# Patient Record
Sex: Male | Born: 1955 | Race: Black or African American | Hispanic: No | Marital: Married | Smoking: Former smoker
Health system: Southern US, Community
[De-identification: ages and names within clinical notes are randomized; demographics above are authoritative.]

## PROBLEM LIST (undated history)

## (undated) DIAGNOSIS — I10 Essential (primary) hypertension: Secondary | ICD-10-CM

## (undated) DIAGNOSIS — E119 Type 2 diabetes mellitus without complications: Secondary | ICD-10-CM

## (undated) HISTORY — PX: COLON SURGERY: SHX602

## (undated) HISTORY — PX: REPLACEMENT TOTAL KNEE: SUR1224

## (undated) HISTORY — PX: ROTATOR CUFF REPAIR: SHX139

## (undated) HISTORY — PX: ANKLE SURGERY: SHX546

---

## 2004-12-25 ENCOUNTER — Emergency Department: Payer: Self-pay | Admitting: Unknown Physician Specialty

## 2008-12-23 ENCOUNTER — Emergency Department: Payer: Self-pay | Admitting: Emergency Medicine

## 2008-12-27 ENCOUNTER — Ambulatory Visit: Payer: Self-pay | Admitting: Unknown Physician Specialty

## 2008-12-30 ENCOUNTER — Inpatient Hospital Stay: Payer: Self-pay | Admitting: Unknown Physician Specialty

## 2015-07-27 ENCOUNTER — Emergency Department: Payer: Self-pay

## 2015-07-27 ENCOUNTER — Emergency Department
Admission: EM | Admit: 2015-07-27 | Discharge: 2015-07-28 | Disposition: A | Discharge status: Home / Self Care | Payer: Self-pay | Attending: Emergency Medicine | Admitting: Emergency Medicine

## 2015-07-27 ENCOUNTER — Encounter: Payer: Self-pay | Admitting: Emergency Medicine

## 2015-07-27 DIAGNOSIS — Z7982 Long term (current) use of aspirin: Secondary | ICD-10-CM | POA: Insufficient documentation

## 2015-07-27 DIAGNOSIS — R000 Tachycardia, unspecified: Secondary | ICD-10-CM | POA: Insufficient documentation

## 2015-07-27 DIAGNOSIS — R079 Chest pain, unspecified: Secondary | ICD-10-CM | POA: Insufficient documentation

## 2015-07-27 DIAGNOSIS — E119 Type 2 diabetes mellitus without complications: Secondary | ICD-10-CM | POA: Insufficient documentation

## 2015-07-27 DIAGNOSIS — R05 Cough: Secondary | ICD-10-CM | POA: Insufficient documentation

## 2015-07-27 DIAGNOSIS — F17210 Nicotine dependence, cigarettes, uncomplicated: Secondary | ICD-10-CM | POA: Insufficient documentation

## 2015-07-27 DIAGNOSIS — Z79899 Other long term (current) drug therapy: Secondary | ICD-10-CM | POA: Insufficient documentation

## 2015-07-27 DIAGNOSIS — R067 Sneezing: Secondary | ICD-10-CM | POA: Insufficient documentation

## 2015-07-27 DIAGNOSIS — I10 Essential (primary) hypertension: Secondary | ICD-10-CM | POA: Insufficient documentation

## 2015-07-27 HISTORY — DX: Essential (primary) hypertension: I10

## 2015-07-27 HISTORY — DX: Type 2 diabetes mellitus without complications: E11.9

## 2015-07-27 LAB — COMPREHENSIVE METABOLIC PANEL
ALT: 12 U/L — ABNORMAL LOW (ref 17–63)
ANION GAP: 9 (ref 5–15)
AST: 13 U/L — ABNORMAL LOW (ref 15–41)
Albumin: 4.1 g/dL (ref 3.5–5.0)
Alkaline Phosphatase: 77 U/L (ref 38–126)
BUN: 29 mg/dL — ABNORMAL HIGH (ref 6–20)
CALCIUM: 8.2 mg/dL — AB (ref 8.9–10.3)
CHLORIDE: 102 mmol/L (ref 101–111)
CO2: 21 mmol/L — AB (ref 22–32)
Creatinine, Ser: 2.05 mg/dL — ABNORMAL HIGH (ref 0.61–1.24)
GFR calc non Af Amer: 34 mL/min — ABNORMAL LOW (ref >60)
GFR, EST AFRICAN AMERICAN: 39 mL/min — AB (ref >60)
Glucose, Bld: 243 mg/dL — ABNORMAL HIGH (ref 65–99)
POTASSIUM: 3.8 mmol/L (ref 3.5–5.1)
SODIUM: 132 mmol/L — AB (ref 135–145)
Total Bilirubin: 0.6 mg/dL (ref 0.3–1.2)
Total Protein: 7.3 g/dL (ref 6.5–8.1)

## 2015-07-27 LAB — FIBRIN DERIVATIVES D-DIMER (ARMC ONLY): FIBRIN DERIVATIVES D-DIMER (ARMC): 533 — AB (ref 0–499)

## 2015-07-27 LAB — CBC
HCT: 42.3 % (ref 40.0–52.0)
Hemoglobin: 14.7 g/dL (ref 13.0–18.0)
MCH: 32.9 pg (ref 26.0–34.0)
MCHC: 34.9 g/dL (ref 32.0–36.0)
MCV: 94.4 fL (ref 80.0–100.0)
PLATELETS: 172 10*3/uL (ref 150–440)
RBC: 4.48 MIL/uL (ref 4.40–5.90)
RDW: 13.5 % (ref 11.5–14.5)
WBC: 9.3 10*3/uL (ref 3.8–10.6)

## 2015-07-27 LAB — TROPONIN I: Troponin I: <0.03 ng/mL (ref <0.031)

## 2015-07-27 MED ORDER — TRAMADOL HCL 50 MG PO TABS
50.0000 mg | ORAL_TABLET | Freq: Once | ORAL | Status: AC
  Administered 2015-07-27: 50 mg via ORAL
  Filled 2015-07-27: qty 1

## 2015-07-27 MED ORDER — SODIUM CHLORIDE 0.9 % IV BOLUS (SEPSIS)
1000.0000 mL | Freq: Once | INTRAVENOUS | Status: AC
  Administered 2015-07-27: 1000 mL via INTRAVENOUS

## 2015-07-27 NOTE — ED Notes (Addendum)
Pt says he sneezed 1 week ago and then has had a cough since-when he sneezed he experienced sharp pain just right of center chest; pain has been there since; denies shortness of breath; denies fever; cough is nonproductive; pt says no SOB but faint audible wheezes noted in triage

## 2015-07-27 NOTE — ED Provider Notes (Signed)
[facility] Emergency Department Provider Note  ____________________________________________  Time seen: Approximately 2317 PM  I have reviewed the triage vital signs and the nursing notes.   HISTORY  Chief Complaint Chest Pain and Cough    HPI Aaron Pham is a 59 y.o. male who comes into the hospital today with chest pain. The patient reports that he sneezed one week ago and he sneezed so hard that he started having some pain in the right side of his chest. The patient reports that he has been taking Profen for pain but the pain has continued. The patient reports that when he is not moving it doesn't hurt but when he moves OR sneezes the pain is worse. The patient reports he is here because of continued for the past week. The patient reports that he goes to the [facility] in [place] but has not been there for this pain. The patient reports that he's only been coughing and sneezing a little bit. He denies any fever shortness of breath dizziness lightheadedness sweats abdominal pain nausea or vomiting. The patient rates his pain a 6 out of 10 in intensity when he moves. The patient reports that 6-7 years ago he had the flu and cough so much that he bruised his ribs but he was not in the same location. The patient comes in tonight for further evaluation of these symptoms. The patient reports that he does have a history of seasonal allergies.   Past Medical History  Diagnosis Date  . Diabetes mellitus without complication (HCC)   . Hypertension     There are no active problems to display for this patient.   Past Surgical History  Procedure Laterality Date  . Replacement total knee    . Rotator cuff repair    . Ankle surgery    . Colon surgery      Current Outpatient Rx  Name  Route  Sig  Dispense  Refill  . aspirin 81 MG tablet   Oral   Take 81 mg by mouth daily.         . cetirizine (ZYRTEC) 10 MG tablet   Oral   Take 10 mg by mouth daily.         .  CHLORTHALIDONE PO   Oral   Take 0.5 tablets by mouth daily.         Marland Kitchen. GLIPIZIDE PO   Oral   Take 0.5 tablets by mouth 2 (two) times daily.         Marland Kitchen. LISINOPRIL PO   Oral   Take 1 tablet by mouth daily.         . metFORMIN (GLUCOPHAGE) 1000 MG tablet   Oral   Take 1,000 mg by mouth 2 (two) times daily with a meal.         . traMADol (ULTRAM) 50 MG tablet   Oral   Take 1 tablet (50 mg total) by mouth every 6 (six) hours as needed.   12 tablet   0     Allergies Hydroxyzine  History reviewed. No pertinent family history.  Social History Social History  Substance Use Topics  . Smoking status: Current Some Day Smoker    Types: Cigarettes  . Smokeless tobacco: Never Used  . Alcohol Use: Yes    Review of Systems Constitutional: No fever/chills Eyes: No visual changes. ENT: No sore throat. Cardiovascular:  chest pain Respiratory: Cough and sneezing but Denies shortness of breath. Gastrointestinal: No abdominal pain.  No nausea, no  vomiting.  No diarrhea.  No constipation. Genitourinary: Negative for dysuria. Musculoskeletal: Negative for back pain. Skin: Negative for rash. Neurological: Negative for headaches, focal weakness or numbness.  10-point ROS otherwise negative.  ____________________________________________   PHYSICAL EXAM:  VITAL SIGNS: ED Triage Vitals  Enc Vitals Group     BP 07/27/15 2240 117/60 mmHg     Pulse Rate 07/27/15 2240 107     Resp 07/27/15 2240 24     Temp 07/27/15 2240 98.8 F (37.1 C)     Temp Source 07/27/15 2240 Oral     SpO2 07/27/15 2240 94 %     Weight 07/27/15 2240 240 lb (108.863 kg)     Height 07/27/15 2240  (1.676 m)     Head Cir --      Peak Flow --      Pain Score 07/27/15 2240 6     Pain Loc --      Pain Edu? --      Excl. in GC? --     Constitutional: Alert and oriented. Well appearing and in mild distress. Eyes: Conjunctivae are normal. PERRL. EOMI. Head: Atraumatic. Nose: No  congestion/rhinnorhea. Mouth/Throat: Mucous membranes are moist.  Oropharynx non-erythematous. Cardiovascular: Tachycardia regular rhythm. Grossly normal heart sounds.  Good peripheral circulation. Respiratory: Normal respiratory effort.  No retractions. Lungs CTAB. Gastrointestinal: Soft and nontender. No distention. Positive bowel sounds Musculoskeletal: No lower extremity tenderness nor edema.   Neurologic:  Normal speech and language.  Skin:  Skin is warm, dry and intact.  Psychiatric: Mood and affect are normal.   ____________________________________________   LABS (all labs ordered are listed, but only abnormal results are displayed)  Labs Reviewed  COMPREHENSIVE METABOLIC PANEL - Abnormal; Notable for the following:    Sodium 132 (*)    CO2 21 (*)    Glucose, Bld 243 (*)    BUN 29 (*)    Creatinine, Ser 2.05 (*)    Calcium 8.2 (*)    AST 13 (*)    ALT 12 (*)    GFR calc non Af Amer 34 (*)    GFR calc Af Amer 39 (*)    All other components within normal limits  FIBRIN DERIVATIVES D-DIMER ([facility] ONLY) - Abnormal; Notable for the following:    Fibrin derivatives D-dimer ([facility]) 533 (*)    All other components within normal limits  CBC  TROPONIN I  BRAIN NATRIURETIC PEPTIDE  I-STAT CHEM 8, ED   ____________________________________________  EKG  ED ECG REPORT I, Rebecka Apley, the attending physician, personally viewed and interpreted this ECG.   Date: 07/27/2015  EKG Time: 2237  Rate: 104  Rhythm: sinus tachycardia  Axis: normal  Intervals:none  ST&T Change: none  ____________________________________________  RADIOLOGY  Chest x-ray: Minimal subsegmental atelectasis at the left lung base, otherwise no acute process CT chest: No definite acute intrathoracic process allowing for breathing motion, mild cardiomegaly and coronary artery calcifications. ____________________________________________   PROCEDURES  Procedure(s) performed: None  Critical  Care performed: No  ____________________________________________   INITIAL IMPRESSION / ASSESSMENT AND PLAN / ED COURSE  Pertinent labs & imaging results that were available during my care of the patient were reviewed by me and considered in my medical decision making (see chart for details).  This is a 59 year old male who comes in today with some chest pain after sneezing one week ago. The patient reports that when he is laying still the pain is not bad but when he is moving  its worse. My concern is that the patient does have some tachycardia at this time. I will have some blood work done and didn't check a d-dimer as the patient also does say he has pain with deep inspiration. I will give the patient a liter of normal saline as well as tramadol 50 mg orally for pain. I will reassess the patient once I received the results of his blood work as well as after his liter of normal saline.  The patient's pain was improved after tramadol and his heart rate was improved after 1L NS. The patient's CT scan did not show a PE and his blood work is unremarkable. He will be discharged to follow up with his primary care physician. As the patient has had chest pain for 1 week that has not changed, I feel that we do not need to repeat his troponin. The patient agrees with the plan as stated ____________________________________________   FINAL CLINICAL IMPRESSION(S) / ED DIAGNOSES  Final diagnoses:  Chest pain, unspecified chest pain type      Rebecka Apley, MD 07/28/15 [phone]

## 2015-07-28 ENCOUNTER — Emergency Department: Payer: Non-veteran care

## 2015-07-28 LAB — BRAIN NATRIURETIC PEPTIDE: B Natriuretic Peptide: 13 pg/mL (ref 0.0–100.0)

## 2015-07-28 MED ORDER — IOHEXOL 350 MG/ML SOLN
80.0000 mL | Freq: Once | INTRAVENOUS | Status: AC | PRN
  Administered 2015-07-28: 80 mL via INTRAVENOUS

## 2015-07-28 MED ORDER — SODIUM CHLORIDE 0.9 % IV BOLUS (SEPSIS): 500.0000 mL | Freq: Once | INTRAVENOUS | Status: DC

## 2015-07-28 MED ORDER — TRAMADOL HCL 50 MG PO TABS: 50.0000 mg | ORAL_TABLET | Freq: Four times a day (QID) | ORAL | Status: DC | PRN

## 2015-07-28 NOTE — Discharge Instructions (Signed)
Nonspecific Chest Pain  °Chest pain can be caused by many different conditions. There is always a chance that your pain could be related to something serious, such as a heart attack or a blood clot in your lungs. Chest pain can also be caused by conditions that are not life-threatening. If you have chest pain, it is very important to follow up with your health care provider. °CAUSES  °Chest pain can be caused by: °· Heartburn. °· Pneumonia or bronchitis. °· Anxiety or stress. °· Inflammation around your heart (pericarditis) or lung (pleuritis or pleurisy). °· A blood clot in your lung. °· A collapsed lung (pneumothorax). It can develop suddenly on its own (spontaneous pneumothorax) or from trauma to the chest. °· Shingles infection (varicella-zoster virus). °· Heart attack. °· Damage to the bones, muscles, and cartilage that make up your chest wall. This can include: °¨ Bruised bones due to injury. °¨ Strained muscles or cartilage due to frequent or repeated coughing or overwork. °¨ Fracture to one or more ribs. °¨ Sore cartilage due to inflammation (costochondritis). °RISK FACTORS  °Risk factors for chest pain may include: °· Activities that increase your risk for trauma or injury to your chest. °· Respiratory infections or conditions that cause frequent coughing. °· Medical conditions or overeating that can cause heartburn. °· Heart disease or family history of heart disease. °· Conditions or health behaviors that increase your risk of developing a blood clot. °· Having had chicken pox (varicella zoster). °SIGNS AND SYMPTOMS °Chest pain can feel like: °· Burning or tingling on the surface of your chest or deep in your chest. °· Crushing, pressure, aching, or squeezing pain. °· Dull or sharp pain that is worse when you move, cough, or take a deep breath. °· Pain that is also felt in your back, neck, shoulder, or arm, or pain that spreads to any of these areas. °Your chest pain may come and go, or it may stay  constant. °DIAGNOSIS °Lab tests or other studies may be needed to find the cause of your pain. Your health care provider may have you take a test called an ambulatory ECG (electrocardiogram). An ECG records your heartbeat patterns at the time the test is performed. You may also have other tests, such as: °· Transthoracic echocardiogram (TTE). During echocardiography, sound waves are used to create a picture of all of the heart structures and to look at how blood flows through your heart. °· Transesophageal echocardiogram (TEE). This is a more advanced imaging test that obtains images from inside your body. It allows your health care provider to see your heart in finer detail. °· Cardiac monitoring. This allows your health care provider to monitor your heart rate and rhythm in real time. °· Holter monitor. This is a portable device that records your heartbeat and can help to diagnose abnormal heartbeats. It allows your health care provider to track your heart activity for several days, if needed. °· Stress tests. These can be done through exercise or by taking medicine that makes your heart beat more quickly. °· Blood tests. °· Imaging tests. °TREATMENT  °Your treatment depends on what is causing your chest pain. Treatment may include: °· Medicines. These may include: °¨ Acid blockers for heartburn. °¨ Anti-inflammatory medicine. °¨ Pain medicine for inflammatory conditions. °¨ Antibiotic medicine, if an infection is present. °¨ Medicines to dissolve blood clots. °¨ Medicines to treat coronary artery disease. °· Supportive care for conditions that do not require medicines. This may include: °¨ Resting. °¨ Applying heat   or cold packs to injured areas. °¨ Limiting activities until pain decreases. °HOME CARE INSTRUCTIONS °· If you were prescribed an antibiotic medicine, finish it all even if you start to feel better. °· Avoid any activities that bring on chest pain. °· Do not use any tobacco products, including  cigarettes, chewing tobacco, or electronic cigarettes. If you need help quitting, ask your health care provider. °· Do not drink alcohol. °· Take medicines only as directed by your health care provider. °· Keep all follow-up visits as directed by your health care provider. This is important. This includes any further testing if your chest pain does not go away. °· If heartburn is the cause for your chest pain, you may be told to keep your head raised (elevated) while sleeping. This reduces the chance that acid will go from your stomach into your esophagus. °· Make lifestyle changes as directed by your health care provider. These may include: °¨ Getting regular exercise. Ask your health care provider to suggest some activities that are safe for you. °¨ Eating a heart-healthy diet. A registered dietitian can help you to learn healthy eating options. °¨ Maintaining a healthy weight. °¨ Managing diabetes, if necessary. °¨ Reducing stress. °SEEK MEDICAL CARE IF: °· Your chest pain does not go away after treatment. °· You have a rash with blisters on your chest. °· You have a fever. °SEEK IMMEDIATE MEDICAL CARE IF:  °· Your chest pain is worse. °· You have an increasing cough, or you cough up blood. °· You have severe abdominal pain. °· You have severe weakness. °· You faint. °· You have chills. °· You have sudden, unexplained chest discomfort. °· You have sudden, unexplained discomfort in your arms, back, neck, or jaw. °· You have shortness of breath at any time. °· You suddenly start to sweat, or your skin gets clammy. °· You feel nauseous or you vomit. °· You suddenly feel light-headed or dizzy. °· Your heart begins to beat quickly, or it feels like it is skipping beats. °These symptoms may represent a serious problem that is an emergency. Do not wait to see if the symptoms will go away. Get medical help right away. Call your local emergency services (911 in the [place] Do not drive yourself to the hospital. °  °This  information is not intended to replace advice given to you by your health care provider. Make sure you discuss any questions you have with your health care provider. °  °Document Released: 06/09/2005 Document Revised: 09/20/2014 Document Reviewed: 04/05/2014 °Elsevier Interactive Patient Education ©2016 [employer] ° °

## 2018-11-28 ENCOUNTER — Other Ambulatory Visit: Payer: Self-pay

## 2018-11-28 ENCOUNTER — Emergency Department: Payer: Self-pay

## 2018-11-28 ENCOUNTER — Emergency Department
Admission: EM | Admit: 2018-11-28 | Discharge: 2018-11-28 | Disposition: A | Discharge status: Home / Self Care | Payer: Self-pay | Attending: Emergency Medicine | Admitting: Emergency Medicine

## 2018-11-28 ENCOUNTER — Encounter: Payer: Self-pay | Admitting: Emergency Medicine

## 2018-11-28 DIAGNOSIS — J111 Influenza due to unidentified influenza virus with other respiratory manifestations: Secondary | ICD-10-CM

## 2018-11-28 DIAGNOSIS — Z7984 Long term (current) use of oral hypoglycemic drugs: Secondary | ICD-10-CM | POA: Insufficient documentation

## 2018-11-28 DIAGNOSIS — Z79899 Other long term (current) drug therapy: Secondary | ICD-10-CM | POA: Insufficient documentation

## 2018-11-28 DIAGNOSIS — J101 Influenza due to other identified influenza virus with other respiratory manifestations: Secondary | ICD-10-CM | POA: Insufficient documentation

## 2018-11-28 DIAGNOSIS — Z87891 Personal history of nicotine dependence: Secondary | ICD-10-CM | POA: Insufficient documentation

## 2018-11-28 DIAGNOSIS — I10 Essential (primary) hypertension: Secondary | ICD-10-CM | POA: Insufficient documentation

## 2018-11-28 DIAGNOSIS — Z7982 Long term (current) use of aspirin: Secondary | ICD-10-CM | POA: Insufficient documentation

## 2018-11-28 DIAGNOSIS — E119 Type 2 diabetes mellitus without complications: Secondary | ICD-10-CM | POA: Insufficient documentation

## 2018-11-28 LAB — INFLUENZA PANEL BY PCR (TYPE A & B)
INFLBPCR: POSITIVE — AB
Influenza A By PCR: NEGATIVE

## 2018-11-28 NOTE — ED Provider Notes (Signed)
[facility] Emergency Department Provider Note ____________________________________________  Time seen: 1030  I have reviewed the triage vital signs and the nursing notes.  HISTORY  Chief Complaint  Cough  HPI Aaron Pham is a 63 y.o. male resents himself to the ED for evaluation of a 2-week complaint of intermittent cough.  Patient denies any frank fevers, but has noted some chills.  He reports nasal drainage and an intermittently productive cough.  Patient denies any body aches, nausea, vomiting, chest pain, or shortness of breath.  He has not taken any medications in the interim for symptom relief.  He did receive the seasonal flu vaccine.  He denies any recent travel, sick or high risk contacts, or other exposures. He did receive the seasonal flu vaccine.   Past Medical History:  Diagnosis Date  . Diabetes mellitus without complication (HCC)   . Hypertension     There are no active problems to display for this patient.   Past Surgical History:  Procedure Laterality Date  . ANKLE SURGERY    . COLON SURGERY    . REPLACEMENT TOTAL KNEE    . ROTATOR CUFF REPAIR      Prior to Admission medications   Medication Sig Start Date End Date Taking? Authorizing Provider  aspirin 81 MG tablet Take 81 mg by mouth daily.    [provider]  cetirizine (ZYRTEC) 10 MG tablet Take 10 mg by mouth daily.    [provider]  CHLORTHALIDONE PO Take 0.5 tablets by mouth daily.    [provider]  GLIPIZIDE PO Take 0.5 tablets by mouth 2 (two) times daily.    [provider]  LISINOPRIL PO Take 1 tablet by mouth daily.    [provider]  metFORMIN (GLUCOPHAGE) 1000 MG tablet Take 1,000 mg by mouth 2 (two) times daily with a meal.    [provider]    Allergies Hydroxyzine  No family history on file.  Social History Social History   Tobacco Use  . Smoking status: Former Smoker    Types: Cigarettes  .  Smokeless tobacco: Never Used  Substance Use Topics  . Alcohol use: Yes  . Drug use: No    Review of Systems  Constitutional: Negative for fever.  Reports chills. Eyes: Negative for visual changes. ENT: Negative for sore throat. Cardiovascular: Negative for chest pain. Respiratory: Negative for shortness of breath.  Reports cough as above. Gastrointestinal: Negative for abdominal pain, vomiting and diarrhea. Genitourinary: Negative for dysuria. Musculoskeletal: Negative for back pain. Skin: Negative for rash. Neurological: Negative for headaches, focal weakness or numbness. ____________________________________________  PHYSICAL EXAM:  VITAL SIGNS: ED Triage Vitals  Enc Vitals Group     BP 11/28/18 1005 (!) 121/92     Pulse Rate 11/28/18 1005 93     Resp 11/28/18 1005 20     Temp 11/28/18 1005 99 F (37.2 C)     Temp Source 11/28/18 1005 Oral     SpO2 11/28/18 1005 97 %     Weight 11/28/18 1006 240 lb (108.9 kg)     Height 11/28/18 1006 5\' 6"  (1.676 m)     Head Circumference --      Peak Flow --      Pain Score 11/28/18 1005 0     Pain Loc --      Pain Edu? --      Excl. in GC? --     Constitutional: Alert and oriented. Well appearing and in no distress.  Head: Normocephalic and atraumatic. Eyes: Conjunctivae are normal. Normal extraocular movements Neck: Supple. Normal ROM without crepitus. Hematological/Lymphatic/Immunological: No cervical lymphadenopathy. Cardiovascular: Normal rate, regular rhythm. Normal distal pulses. Respiratory: Normal respiratory effort. No rales/rhonchi. Mild end-expiratory wheezes bilaterally.  Gastrointestinal: Soft and nontender. No distention. Musculoskeletal: Nontender with normal range of motion in all extremities.  Neurologic:  Normal gait without ataxia. Normal speech and language. No gross focal neurologic deficits are appreciated. Skin:  Skin is warm, dry and intact. No rash noted. ____________________________________________     LABS (pertinent positives/negatives) Labs Reviewed  INFLUENZA PANEL BY PCR (TYPE A & B) - Abnormal; Notable for the following components:      Result Value   Influenza B By PCR POSITIVE (*)    All other components within normal limits  ____________________________________________   RADIOLOGY  CXR IMPRESSION: 9 mm nodular opacity right base which appears more medial than would be expected with nipple shadow. Given this circumstance, unenhanced chest CT is advised to further evaluate.  Calcified granuloma left upper lobe. Left base atelectasis. No consolidation.  Stable cardiac silhouette.  Chest CT w/o CM IMPRESSION: 1. No suspicious pulmonary nodule. No CT correlate for the nodular density seen on chest x-ray. 2. Moderate central peribronchial thickening, consistent with airways inflammation. No consolidation. 3.  Aortic atherosclerosis (ICD10-I70.0). 4. Right adrenal adenoma, incompletely visualized. ____________________________________________  PROCEDURES  Procedures ____________________________________________  INITIAL IMPRESSION / ASSESSMENT AND PLAN / ED COURSE  Patient with ED evaluation of 2-week complaint of intermittent cough and congestion.  He is sent here at the request of his employer, for evaluation of ongoing symptoms.  Patient was found to be influenza B positive on PCR screen.  Patient clinically stable without any signs of acute respiratory distress.  He has declined any further medication management at this time.  He will manage his symptoms with over-the-counter aids as discussed.  A screening test CT was performed following and an incidental finding on his plain film.  The initial right lower lobe nodule was not found on CT scanning.  Patient is reassured by his overall results.  He is discharged with a work note holding him out of work for the next week.  He is advised to return to the ED for acutely worsening respiratory  symptoms. ____________________________________________  FINAL CLINICAL IMPRESSION(S) / ED DIAGNOSES  Final diagnoses:  Influenza  Influenza B      Karmen Stabs, Charlesetta Ivory, PA-C 11/28/18 1230    Rockne Menghini, MD 11/28/18 1546

## 2018-11-28 NOTE — ED Notes (Signed)
First Nurse Note: Patient complaining of cough, given face mask to wear.

## 2018-11-28 NOTE — ED Triage Notes (Signed)
Pt in via POV, reports nasal drainage, cough x one week; states "I feel like I have the flu but I get the flu shot."  Ambulatory to triage.  NAD noted at this time.

## 2018-11-28 NOTE — Discharge Instructions (Addendum)
You have been confirmed as influenza positive. Take OTC medicine to control cough and congestion. Rest, hydrate, and limit contact with others by staying at home. Follow-up with the [facility] or return to this ED for worsening respiratory symptoms.

## 2018-11-28 NOTE — ED Notes (Addendum)
See triage note  States he developed body aches with some fever and cough 2 weeks ago  States he conts to have cough  Denies any fever  States he thinks he has a cold  Afebrile on arrival

## 2020-05-09 ENCOUNTER — Telehealth: Payer: Self-pay | Admitting: Nurse Practitioner

## 2020-05-09 ENCOUNTER — Other Ambulatory Visit: Payer: Self-pay | Admitting: Nurse Practitioner

## 2020-05-09 NOTE — Telephone Encounter (Signed)
Error

## 2020-05-09 NOTE — Progress Notes (Signed)
I connected by phone with Aaron Pham on 05/09/2020 at 4:19 PM to discuss the potential use of a new treatment for mild to moderate COVID-19 viral infection in non-hospitalized patients.  This patient is a 64 y.o. male that meets the FDA criteria for Emergency Use Authorization of COVID monoclonal antibody casirivimab/imdevimab.  Has a (+) direct SARS-CoV-2 viral test result  Has mild or moderate COVID-19   Is NOT hospitalized due to COVID-19  Is within 10 days of symptom onset  Has at least one of the high risk factor(s) for progression to severe COVID-19 and/or hospitalization as defined in EUA.  Specific high risk criteria : BMI > 25, Diabetes and Cardiovascular disease or hypertension   I have spoken and communicated the following to the patient or parent/caregiver regarding COVID monoclonal antibody treatment:  1. FDA has authorized the emergency use for the treatment of mild to moderate COVID-19 in adults and pediatric patients with positive results of direct SARS-CoV-2 viral testing who are 64 years of age and older weighing at least 40 kg, and who are at high risk for progressing to severe COVID-19 and/or hospitalization.  2. The significant known and potential risks and benefits of COVID monoclonal antibody, and the extent to which such potential risks and benefits are unknown.  3. Information on available alternative treatments and the risks and benefits of those alternatives, including clinical trials.  4. Patients treated with COVID monoclonal antibody should continue to self-isolate and use infection control measures (e.g., wear mask, isolate, social distance, avoid sharing personal items, clean and disinfect "high touch" surfaces, and frequent handwashing) according to CDC guidelines.   5. The patient or parent/caregiver has the option to accept or refuse COVID monoclonal antibody treatment.  After reviewing this information with the patient, The patient agreed to proceed  with receiving casirivimab\imdevimab infusion and will be provided a copy of the Fact sheet prior to receiving the infusion.  Sx onset 05/07/20. Set up for infusion on 05/11/20. Directions given to [facility]. Pt is aware that insurance will be charged an infusion fee.   Sherril Cong 05/09/2020 4:19 PM

## 2020-05-11 ENCOUNTER — Ambulatory Visit (HOSPITAL_COMMUNITY)
Admission: RE | Admit: 2020-05-11 | Discharge: 2020-05-11 | Disposition: A | Discharge status: Home / Self Care | Payer: No Typology Code available for payment source | Source: Ambulatory Visit | Attending: Pulmonary Disease | Admitting: Pulmonary Disease

## 2020-05-11 DIAGNOSIS — U071 COVID-19: Secondary | ICD-10-CM | POA: Diagnosis not present

## 2020-05-11 MED ORDER — SODIUM CHLORIDE 0.9 % IV SOLN
1200.0000 mg | Freq: Once | INTRAVENOUS | Status: AC
  Administered 2020-05-11: 1200 mg via INTRAVENOUS
  Filled 2020-05-11: qty 10

## 2020-05-11 MED ORDER — ALBUTEROL SULFATE HFA 108 (90 BASE) MCG/ACT IN AERS: 2.0000 | INHALATION_SPRAY | Freq: Once | RESPIRATORY_TRACT | Status: DC | PRN

## 2020-05-11 MED ORDER — SODIUM CHLORIDE 0.9 % IV SOLN: INTRAVENOUS | Status: DC | PRN

## 2020-05-11 MED ORDER — EPINEPHRINE 0.3 MG/0.3ML IJ SOAJ: 0.3000 mg | Freq: Once | INTRAMUSCULAR | Status: DC | PRN

## 2020-05-11 MED ORDER — DIPHENHYDRAMINE HCL 50 MG/ML IJ SOLN: 50.0000 mg | Freq: Once | INTRAMUSCULAR | Status: DC | PRN

## 2020-05-11 MED ORDER — FAMOTIDINE IN NACL 20-0.9 MG/50ML-% IV SOLN: 20.0000 mg | Freq: Once | INTRAVENOUS | Status: DC | PRN

## 2020-05-11 MED ORDER — SODIUM CHLORIDE 0.9 % IV BOLUS
1000.0000 mL | Freq: Once | INTRAVENOUS | Status: AC
  Administered 2020-05-11: 1000 mL via INTRAVENOUS

## 2020-05-11 MED ORDER — METHYLPREDNISOLONE SODIUM SUCC 125 MG IJ SOLR: 125.0000 mg | Freq: Once | INTRAMUSCULAR | Status: DC | PRN

## 2020-05-11 NOTE — Progress Notes (Signed)
  Diagnosis: COVID-19  Physician:Dr Wright   Procedure: [facility] Med: casirivimab\imdevimab infusion - Provided patient with casirivimab\imdevimab fact sheet for patients, parents and caregivers prior to infusion.  Complications: No immediate complications noted.  Discharge: Discharged home   Tiasia Weberg W 05/11/2020  

## 2020-05-11 NOTE — Discharge Instructions (Signed)

## 2021-04-28 ENCOUNTER — Emergency Department
Admission: EM | Admit: 2021-04-28 | Discharge: 2021-04-28 | Disposition: A | Discharge status: Home / Self Care | Payer: No Typology Code available for payment source | Attending: Emergency Medicine | Admitting: Emergency Medicine

## 2021-04-28 ENCOUNTER — Emergency Department: Payer: No Typology Code available for payment source

## 2021-04-28 ENCOUNTER — Other Ambulatory Visit: Payer: Self-pay

## 2021-04-28 ENCOUNTER — Encounter: Payer: Self-pay | Admitting: Emergency Medicine

## 2021-04-28 DIAGNOSIS — I10 Essential (primary) hypertension: Secondary | ICD-10-CM | POA: Diagnosis not present

## 2021-04-28 DIAGNOSIS — Z79899 Other long term (current) drug therapy: Secondary | ICD-10-CM | POA: Diagnosis not present

## 2021-04-28 DIAGNOSIS — E119 Type 2 diabetes mellitus without complications: Secondary | ICD-10-CM | POA: Diagnosis not present

## 2021-04-28 DIAGNOSIS — R109 Unspecified abdominal pain: Secondary | ICD-10-CM | POA: Insufficient documentation

## 2021-04-28 DIAGNOSIS — N50812 Left testicular pain: Secondary | ICD-10-CM | POA: Diagnosis not present

## 2021-04-28 DIAGNOSIS — Z7984 Long term (current) use of oral hypoglycemic drugs: Secondary | ICD-10-CM | POA: Insufficient documentation

## 2021-04-28 DIAGNOSIS — Z87891 Personal history of nicotine dependence: Secondary | ICD-10-CM | POA: Diagnosis not present

## 2021-04-28 DIAGNOSIS — Z7982 Long term (current) use of aspirin: Secondary | ICD-10-CM | POA: Insufficient documentation

## 2021-04-28 LAB — CBC
HCT: 47.1 % (ref 39.0–52.0)
Hemoglobin: 16.6 g/dL (ref 13.0–17.0)
MCH: 33.5 pg (ref 26.0–34.0)
MCHC: 35.2 g/dL (ref 30.0–36.0)
MCV: 95.2 fL (ref 80.0–100.0)
Platelets: 157 10*3/uL (ref 150–400)
RBC: 4.95 MIL/uL (ref 4.22–5.81)
RDW: 13.5 % (ref 11.5–15.5)
WBC: 8.5 10*3/uL (ref 4.0–10.5)
nRBC: 0 % (ref 0.0–0.2)

## 2021-04-28 LAB — URINALYSIS, COMPLETE (UACMP) WITH MICROSCOPIC
Bacteria, UA: NONE SEEN
Bilirubin Urine: NEGATIVE
Glucose, UA: >=500 mg/dL — AB
Ketones, ur: 20 mg/dL — AB
Leukocytes,Ua: NEGATIVE
Nitrite: NEGATIVE
Protein, ur: NEGATIVE mg/dL
Specific Gravity, Urine: 1.027 (ref 1.005–1.030)
pH: 5 (ref 5.0–8.0)

## 2021-04-28 LAB — BASIC METABOLIC PANEL
Anion gap: 12 (ref 5–15)
BUN: 18 mg/dL (ref 8–23)
CO2: 18 mmol/L — ABNORMAL LOW (ref 22–32)
Calcium: 8.7 mg/dL — ABNORMAL LOW (ref 8.9–10.3)
Chloride: 107 mmol/L (ref 98–111)
Creatinine, Ser: 1.13 mg/dL (ref 0.61–1.24)
GFR, Estimated: >60 mL/min (ref >60)
Glucose, Bld: 141 mg/dL — ABNORMAL HIGH (ref 70–99)
Potassium: 4.3 mmol/L (ref 3.5–5.1)
Sodium: 137 mmol/L (ref 135–145)

## 2021-04-28 MED ORDER — TRAMADOL HCL 50 MG PO TABS: 50.0000 mg | ORAL_TABLET | Freq: Four times a day (QID) | ORAL | 0 refills | Status: DC | PRN

## 2021-04-28 MED ORDER — KETOROLAC TROMETHAMINE 30 MG/ML IJ SOLN
30.0000 mg | Freq: Once | INTRAMUSCULAR | Status: AC
  Administered 2021-04-28: 30 mg via INTRAVENOUS
  Filled 2021-04-28: qty 1

## 2021-04-28 MED ORDER — OXYCODONE-ACETAMINOPHEN 5-325 MG PO TABS
1.0000 | ORAL_TABLET | ORAL | Status: DC | PRN
  Administered 2021-04-28: 1 via ORAL
  Filled 2021-04-28: qty 1

## 2021-04-28 MED ORDER — SODIUM CHLORIDE 0.9 % IV SOLN
1000.0000 mL | Freq: Once | INTRAVENOUS | Status: AC
  Administered 2021-04-28: 1000 mL via INTRAVENOUS

## 2021-04-28 MED ORDER — ONDANSETRON HCL 4 MG/2ML IJ SOLN
4.0000 mg | Freq: Once | INTRAMUSCULAR | Status: AC
  Administered 2021-04-28: 4 mg via INTRAVENOUS
  Filled 2021-04-28: qty 2

## 2021-04-28 NOTE — ED Triage Notes (Signed)
Pt reports this am started with sudden onset of pain to his left side that moved down and into his left groin. Pt reports some NV as well.

## 2021-04-28 NOTE — ED Triage Notes (Signed)
Pt in via EMS from work. Pt reports when he got to work he started with left groin pain that radiates into his back. Pt also reported some nausea with it. Pt is diabetic.

## 2021-04-28 NOTE — ED Notes (Signed)
See triage note  Presents with sudden onset on flank /abd pain  States pain was on left  Moving into groin area  Positive n/v

## 2021-04-28 NOTE — ED Provider Notes (Signed)
[facility] Emergency Department Provider Note   ____________________________________________    I have reviewed the triage vital signs and the nursing notes.   HISTORY  Chief Complaint Flank Pain, Groin Pain, and Nausea     HPI Aaron Pham is a 65 y.o. male with history of diabetes who presents with complaints of left-sided flank pain.  Patient describes this morning he was feeling well but developed pain in his left testicle which then radiated to his left flank.  He denies dysuria.  No hematuria.  No history of kidney stones.  Is not take anything for this.  No fevers chills.  No injury to the area.  Past Medical History:  Diagnosis Date   Diabetes mellitus without complication (HCC)    Hypertension     There are no problems to display for this patient.   Past Surgical History:  Procedure Laterality Date   ANKLE SURGERY     COLON SURGERY     REPLACEMENT TOTAL KNEE     ROTATOR CUFF REPAIR      Prior to Admission medications   Medication Sig Start Date End Date Taking? Authorizing Provider  traMADol (ULTRAM) 50 MG tablet Take 1 tablet (50 mg total) by mouth every 6 (six) hours as needed. 04/28/21 04/28/22 Yes Jene Every, MD  aspirin 81 MG tablet Take 81 mg by mouth daily.    [provider]  cetirizine (ZYRTEC) 10 MG tablet Take 10 mg by mouth daily.    [provider]  CHLORTHALIDONE PO Take 0.5 tablets by mouth daily.    [provider]  GLIPIZIDE PO Take 0.5 tablets by mouth 2 (two) times daily.    [provider]  LISINOPRIL PO Take 1 tablet by mouth daily.    [provider]  metFORMIN (GLUCOPHAGE) 1000 MG tablet Take 1,000 mg by mouth 2 (two) times daily with a meal.    [provider]     Allergies Hydroxyzine  No family history on file.  Social History Social History   Tobacco Use   Smoking status: Former    Types: Cigarettes   Smokeless tobacco: Never  Vaping Use    Vaping Use: Never used  Substance Use Topics   Alcohol use: Yes   Drug use: No    Review of Systems  Constitutional: No fever/chills Eyes: No visual changes.  ENT: No sore throat. Cardiovascular: Denies chest pain. Respiratory: Denies shortness of breath. Gastrointestinal: As above Genitourinary: As above Musculoskeletal: Negative for back pain. Skin: Negative for rash. Neurological: Negative for headaches or weakness   ____________________________________________   PHYSICAL EXAM:  VITAL SIGNS: ED Triage Vitals  Enc Vitals Group     BP 04/28/21 0846 (!) 122/96     Pulse Rate 04/28/21 0846 86     Resp 04/28/21 0846 18     Temp 04/28/21 0846 (!) 97.5 F (36.4 C)     Temp Source 04/28/21 0846 Oral     SpO2 04/28/21 0846 98 %     Weight 04/28/21 0845 108.9 kg (240 lb)     Height 04/28/21 0845 1.676 m (5\' 6" )     Head Circumference --      Peak Flow --      Pain Score 04/28/21 0845 10     Pain Loc --      Pain Edu? --      Excl. in GC? --     Constitutional: Alert and oriented.   Nose: No congestion/rhinnorhea. Mouth/Throat:  Mucous membranes are moist.   Cardiovascular: Normal rate, regular rhythm. Grossly normal heart sounds.  Good peripheral circulation. Respiratory: Normal respiratory effort.  No retractions. Gastrointestinal: Soft and nontender. No distention.   Musculoskeletal: No lower extremity tenderness nor edema.  Warm and well perfused Neurologic:  Normal speech and language. No gross focal neurologic deficits are appreciated.  Skin:  Skin is warm, dry and intact. No rash noted. Psychiatric: Mood and affect are normal. Speech and behavior are normal.  ____________________________________________   LABS (all labs ordered are listed, but only abnormal results are displayed)  Labs Reviewed  URINALYSIS, COMPLETE (UACMP) WITH MICROSCOPIC - Abnormal; Notable for the following components:      Result Value   Color, Urine YELLOW (*)    APPearance  CLEAR (*)    Glucose, UA >=500 (*)    Hgb urine dipstick SMALL (*)    Ketones, ur 20 (*)    All other components within normal limits  BASIC METABOLIC PANEL - Abnormal; Notable for the following components:   CO2 18 (*)    Glucose, Bld 141 (*)    Calcium 8.7 (*)    All other components within normal limits  CBC   ____________________________________________  EKG  None ____________________________________________  RADIOLOGY  CT renal stone study reviewed by me ____________________________________________   PROCEDURES  Procedure(s) performed: No  Procedures   Critical Care performed: No ____________________________________________   INITIAL IMPRESSION / ASSESSMENT AND PLAN / ED COURSE  Pertinent labs & imaging results that were available during my care of the patient were reviewed by me and considered in my medical decision making (see chart for details).   Patient presents with left flank pain as detailed above, highly suspicious for ureterolithiasis.  Patient is diabetic however no fever.  Urinalysis demonstrates microscopic hematuria, lab work is overall unremarkable.  Will give IV Toradol, IV Zofran, IV fluids, obtain CT renal stone study and reevaluate.  Patient given IV morphine for mild continued pain  This resolved his pain.  CT scan negative for ureterolithiasis or acute abnormality.  Patient feeling significantly better after treatment, question musculoskeletal back pain.  Discussed with patient, appropriate for discharge at this time with very strict return precautions if symptoms return or worsening or changes, he notes that he has very close follow-up at the [place]    ____________________________________________   FINAL CLINICAL IMPRESSION(S) / ED DIAGNOSES  Final diagnoses:  Flank pain        Note:  This document was prepared using Dragon voice recognition software and may include unintentional dictation errors.    Jene Every,  MD 04/28/21 1434

## 2021-06-11 ENCOUNTER — Emergency Department
Admission: EM | Admit: 2021-06-11 | Discharge: 2021-06-11 | Disposition: A | Discharge status: Home / Self Care | Payer: No Typology Code available for payment source | Attending: Emergency Medicine | Admitting: Emergency Medicine

## 2021-06-11 ENCOUNTER — Encounter: Payer: Self-pay | Admitting: Emergency Medicine

## 2021-06-11 ENCOUNTER — Emergency Department: Payer: No Typology Code available for payment source

## 2021-06-11 ENCOUNTER — Other Ambulatory Visit: Payer: Self-pay

## 2021-06-11 DIAGNOSIS — I10 Essential (primary) hypertension: Secondary | ICD-10-CM | POA: Insufficient documentation

## 2021-06-11 DIAGNOSIS — Z79899 Other long term (current) drug therapy: Secondary | ICD-10-CM | POA: Diagnosis not present

## 2021-06-11 DIAGNOSIS — R10A2 Flank pain, left side: Secondary | ICD-10-CM

## 2021-06-11 DIAGNOSIS — R109 Unspecified abdominal pain: Secondary | ICD-10-CM | POA: Diagnosis not present

## 2021-06-11 DIAGNOSIS — Z87891 Personal history of nicotine dependence: Secondary | ICD-10-CM | POA: Diagnosis not present

## 2021-06-11 DIAGNOSIS — E119 Type 2 diabetes mellitus without complications: Secondary | ICD-10-CM | POA: Diagnosis not present

## 2021-06-11 DIAGNOSIS — Z96659 Presence of unspecified artificial knee joint: Secondary | ICD-10-CM | POA: Insufficient documentation

## 2021-06-11 DIAGNOSIS — Z7982 Long term (current) use of aspirin: Secondary | ICD-10-CM | POA: Diagnosis not present

## 2021-06-11 DIAGNOSIS — Z7984 Long term (current) use of oral hypoglycemic drugs: Secondary | ICD-10-CM | POA: Diagnosis not present

## 2021-06-11 LAB — CBC WITH DIFFERENTIAL/PLATELET
Abs Immature Granulocytes: 0.07 10*3/uL (ref 0.00–0.07)
Basophils Absolute: 0 10*3/uL (ref 0.0–0.1)
Basophils Relative: 0 %
Eosinophils Absolute: 0.1 10*3/uL (ref 0.0–0.5)
Eosinophils Relative: 1 %
HCT: 48 % (ref 39.0–52.0)
Hemoglobin: 16.9 g/dL (ref 13.0–17.0)
Immature Granulocytes: 1 %
Lymphocytes Relative: 21 %
Lymphs Abs: 1.6 10*3/uL (ref 0.7–4.0)
MCH: 33.1 pg (ref 26.0–34.0)
MCHC: 35.2 g/dL (ref 30.0–36.0)
MCV: 94.1 fL (ref 80.0–100.0)
Monocytes Absolute: 1.1 10*3/uL — ABNORMAL HIGH (ref 0.1–1.0)
Monocytes Relative: 14 %
Neutro Abs: 4.9 10*3/uL (ref 1.7–7.7)
Neutrophils Relative %: 63 %
Platelets: 197 10*3/uL (ref 150–400)
RBC: 5.1 MIL/uL (ref 4.22–5.81)
RDW: 13.3 % (ref 11.5–15.5)
WBC: 7.8 10*3/uL (ref 4.0–10.5)
nRBC: 0 % (ref 0.0–0.2)

## 2021-06-11 LAB — BASIC METABOLIC PANEL
Anion gap: 11 (ref 5–15)
BUN: 16 mg/dL (ref 8–23)
CO2: 22 mmol/L (ref 22–32)
Calcium: 9.6 mg/dL (ref 8.9–10.3)
Chloride: 104 mmol/L (ref 98–111)
Creatinine, Ser: 1.2 mg/dL (ref 0.61–1.24)
GFR, Estimated: >60 mL/min (ref >60)
Glucose, Bld: 125 mg/dL — ABNORMAL HIGH (ref 70–99)
Potassium: 3.9 mmol/L (ref 3.5–5.1)
Sodium: 137 mmol/L (ref 135–145)

## 2021-06-11 LAB — URINALYSIS, COMPLETE (UACMP) WITH MICROSCOPIC
Bilirubin Urine: NEGATIVE
Glucose, UA: >=500 mg/dL — AB
Ketones, ur: 5 mg/dL — AB
Leukocytes,Ua: NEGATIVE
Nitrite: NEGATIVE
Protein, ur: NEGATIVE mg/dL
Specific Gravity, Urine: 1.035 — ABNORMAL HIGH (ref 1.005–1.030)
Squamous Epithelial / HPF: NONE SEEN (ref 0–5)
pH: 5 (ref 5.0–8.0)

## 2021-06-11 MED ORDER — HYDROMORPHONE HCL 1 MG/ML IJ SOLN
1.0000 mg | Freq: Once | INTRAMUSCULAR | Status: AC
  Administered 2021-06-11: 1 mg via INTRAVENOUS
  Filled 2021-06-11: qty 1

## 2021-06-11 MED ORDER — IOHEXOL 350 MG/ML SOLN
100.0000 mL | Freq: Once | INTRAVENOUS | Status: AC | PRN
  Administered 2021-06-11: 100 mL via INTRAVENOUS

## 2021-06-11 MED ORDER — SODIUM CHLORIDE 0.9 % IV BOLUS
1000.0000 mL | Freq: Once | INTRAVENOUS | Status: AC
  Administered 2021-06-11: 1000 mL via INTRAVENOUS

## 2021-06-11 MED ORDER — ONDANSETRON HCL 4 MG/2ML IJ SOLN
4.0000 mg | Freq: Once | INTRAMUSCULAR | Status: AC
  Administered 2021-06-11: 4 mg via INTRAVENOUS
  Filled 2021-06-11: qty 2

## 2021-06-11 MED ORDER — NAPROXEN 500 MG PO TABS: 500.0000 mg | ORAL_TABLET | Freq: Two times a day (BID) | ORAL | 0 refills | Status: DC

## 2021-06-11 MED ORDER — LIDOCAINE 5 % EX PTCH: 1.0000 | MEDICATED_PATCH | Freq: Two times a day (BID) | CUTANEOUS | 0 refills | Status: AC

## 2021-06-11 NOTE — ED Notes (Signed)
Pt to ED c/o 10/10 sharp stabbing pain in L groin/flank that started this morning. Hx kidney stones. Denies pain/burning/blood in urine. Pt profusely diaphoretic and writhing in bed. IV inserted, labs drawn.Wife at bedside.

## 2021-06-11 NOTE — ED Provider Notes (Signed)
[facility] Emergency Department Provider Note  ____________________________________________  Time seen: Approximately 8:24 AM  I have reviewed the triage vital signs and the nursing notes.   HISTORY  Chief Complaint Flank Pain    HPI Aaron Pham is a 65 y.o. male with a history of hypertension and diabetes who comes ED complaining of left flank pain radiating to left lower quadrant that started this morning, severe, constant, no aggravating or alleviating factors.  No vomiting or diarrhea.  No dysuria frequency or urgency, no hematuria.  Reports he had similar symptoms 5 or 6 weeks ago, was told he probably passed a kidney stone and went home.  He continued to have the pain for another 4 days at that time and then it went away.  Reviewed EMR, work-up from that time was unremarkable including a noncontrast CT scan was negative for stone, no hematuria or signs of infection in the urine, normal creatinine.    Past Medical History:  Diagnosis Date   Diabetes mellitus without complication (HCC)    Hypertension      There are no problems to display for this patient.    Past Surgical History:  Procedure Laterality Date   ANKLE SURGERY     COLON SURGERY     REPLACEMENT TOTAL KNEE     ROTATOR CUFF REPAIR       Prior to Admission medications   Medication Sig Start Date End Date Taking? Authorizing Provider  lidocaine (LIDODERM) 5 % Place 1 patch onto the skin every 12 (twelve) hours. Remove & Discard patch within 12 hours or as directed by MD 06/11/21  Yes Sharman Cheek, MD  naproxen (NAPROSYN) 500 MG tablet Take 1 tablet (500 mg total) by mouth 2 (two) times daily with a meal. 06/11/21  Yes Sharman Cheek, MD  aspirin 81 MG tablet Take 81 mg by mouth daily.    [provider]  cetirizine (ZYRTEC) 10 MG tablet Take 10 mg by mouth daily.    [provider]  CHLORTHALIDONE PO Take 0.5 tablets by mouth daily.    [provider]  GLIPIZIDE PO Take 0.5 tablets by mouth 2 (two) times daily.    [provider]  LISINOPRIL PO Take 1 tablet by mouth daily.    [provider]  metFORMIN (GLUCOPHAGE) 1000 MG tablet Take 1,000 mg by mouth 2 (two) times daily with a meal.    [provider]  traMADol (ULTRAM) 50 MG tablet Take 1 tablet (50 mg total) by mouth every 6 (six) hours as needed. 04/28/21 04/28/22  Jene Every, MD     Allergies Hydroxyzine   No family history on file.  Social History Social History   Tobacco Use   Smoking status: Former    Types: Cigarettes   Smokeless tobacco: Never  Vaping Use   Vaping Use: Never used  Substance Use Topics   Alcohol use: Yes   Drug use: No    Review of Systems  Constitutional:   No fever or chills.  ENT:   No sore throat. No rhinorrhea. Cardiovascular:   No chest pain or syncope. Respiratory:   No dyspnea or cough. Gastrointestinal:   Left lower quadrant abdominal pain as above without vomiting diarrhea or constipation.  Musculoskeletal:   Negative for focal pain or swelling All other systems reviewed and are negative except as documented above in ROS and HPI.  ____________________________________________   PHYSICAL EXAM:  VITAL SIGNS: ED Triage Vitals  Enc Vitals Group  BP 06/11/21 0727 (!) 170/100     Pulse Rate 06/11/21 0727 80     Resp 06/11/21 0727 (!) 21     Temp 06/11/21 0727 98.7 F (37.1 C)     Temp Source 06/11/21 0727 Oral     SpO2 06/11/21 0727 100 %     Weight 06/11/21 0723 240 lb 1.3 oz (108.9 kg)     Height 06/11/21 0723 5\' 6"  (1.676 m)     Head Circumference --      Peak Flow --      Pain Score 06/11/21 0723 10     Pain Loc --      Pain Edu? --      Excl. in GC? --     Vital signs reviewed, nursing assessments reviewed.   Constitutional:   Alert and oriented. Non-toxic appearance.  Very uncomfortable appearing Eyes:   Conjunctivae are normal. EOMI. PERRL. ENT      Head:    Normocephalic and atraumatic.      Nose:   Wearing a mask.      Mouth/Throat:   Wearing a mask.      Neck:   No meningismus. Full ROM. Hematological/Lymphatic/Immunilogical:   No cervical lymphadenopathy. Cardiovascular:   RRR. Symmetric bilateral radial and DP pulses.  No murmurs. Cap refill less than 2 seconds. Respiratory:   Normal respiratory effort without tachypnea/retractions. Breath sounds are clear and equal bilaterally. No wheezes/rales/rhonchi. Gastrointestinal:   Soft with left lower quadrant tenderness . Non distended. There is no CVA tenderness.  No rebound, rigidity, or guarding.  No hernias Genitourinary:   deferred Musculoskeletal:   Normal range of motion in all extremities. No joint effusions.  No lower extremity tenderness.  No edema. Neurologic:   Normal speech and language.  Motor grossly intact. No acute focal neurologic deficits are appreciated.  Skin:    Skin is warm, dry and intact. No rash noted.  No petechiae, purpura, or bullae.  ____________________________________________    LABS (pertinent positives/negatives) (all labs ordered are listed, but only abnormal results are displayed) Labs Reviewed  BASIC METABOLIC PANEL - Abnormal; Notable for the following components:      Result Value   Glucose, Bld 125 (*)    All other components within normal limits  CBC WITH DIFFERENTIAL/PLATELET - Abnormal; Notable for the following components:   Monocytes Absolute 1.1 (*)    All other components within normal limits  URINALYSIS, COMPLETE (UACMP) WITH MICROSCOPIC - Abnormal; Notable for the following components:   Color, Urine YELLOW (*)    APPearance CLEAR (*)    Specific Gravity, Urine 1.035 (*)    Glucose, UA >=500 (*)    Hgb urine dipstick SMALL (*)    Ketones, ur 5 (*)    Bacteria, UA RARE (*)    All other components within normal limits   ____________________________________________   EKG    ____________________________________________     RADIOLOGY  CT ABDOMEN PELVIS W CONTRAST  Result Date: 06/11/2021 CLINICAL DATA:  Left flank pain radiating from left back to left groin EXAM: CT ABDOMEN AND PELVIS WITH CONTRAST TECHNIQUE: Multidetector CT imaging of the abdomen and pelvis was performed using the standard protocol following bolus administration of intravenous contrast. CONTRAST:  06/13/2021 OMNIPAQUE IOHEXOL 350 MG/ML SOLN COMPARISON:  CT abdomen/pelvis 04/28/2021 FINDINGS: Lower chest: The lung bases are clear. The imaged heart is unremarkable. Hepatobiliary: The liver and gallbladder are unremarkable. There is no biliary ductal dilatation. Pancreas: Unremarkable. Spleen: Unremarkable. Adrenals/Urinary Tract: The adrenals are  unremarkable. A 1.0 cm hypodense lesion in the left upper pole and 1.2 cm hypodense lesion in the left interpolar region most likely reflect small cysts. There are no suspicious renal lesions. There are no stones within either kidney or along the course of either ureter. There is no hydronephrosis or hydroureter. The bladder is unremarkable. Stomach/Bowel: There is a small hiatal hernia. The stomach is otherwise unremarkable. There is no evidence of bowel obstruction. There is no abnormal bowel wall thickening or inflammatory change. The appendix is normal. A fat density lesion along the anterior margin of the rectum is unchanged, and may reflect a lipoma. Vascular/Lymphatic: There is scattered calcified atherosclerotic plaque throughout the nonaneurysmal abdominal aorta. The major branch vessels are patent. The main portal and splenic veins are patent. There is no abdominal or pelvic lymphadenopathy. Reproductive: The prostate and seminal vesicles are unremarkable. Other: There is no ascites or free air. There is a small fat containing left inguinal hernia and a tiny fat containing umbilical hernia. Musculoskeletal: There is multilevel degenerative change of the imaged spine. There is no acute osseous abnormality or  aggressive osseous lesion. IMPRESSION: 1. No acute findings in the abdomen or pelvis. Specifically, no evidence of nephrolithiasis or diverticulitis. 2. Small hypodense renal lesions again seen, most liklely cysts. Aortic Atherosclerosis (ICD10-I70.0). Electronically Signed   By: Lesia Hausen M.D.   On: 06/11/2021 09:03    ____________________________________________   PROCEDURES Procedures  ____________________________________________  DIFFERENTIAL DIAGNOSIS   Diverticulitis, kidney stone, hemorrhagic kidney cyst, bowel obstruction  CLINICAL IMPRESSION / ASSESSMENT AND PLAN / ED COURSE  Medications ordered in the ED: Medications  HYDROmorphone (DILAUDID) injection 1 mg (1 mg Intravenous Given 06/11/21 0813)  sodium chloride 0.9 % bolus 1,000 mL (1,000 mLs Intravenous New Bag/Given 06/11/21 0813)  ondansetron (ZOFRAN) injection 4 mg (4 mg Intravenous Given 06/11/21 0813)  iohexol (OMNIPAQUE) 350 MG/ML injection 100 mL (100 mLs Intravenous Contrast Given 06/11/21 0827)    Pertinent labs & imaging results that were available during my care of the patient were reviewed by me and considered in my medical decision making (see chart for details).  Kiernan Atkerson was evaluated in Emergency Department on 06/11/2021 for the symptoms described in the history of present illness. He was evaluated in the context of the global COVID-19 pandemic, which necessitated consideration that the patient might be at risk for infection with the SARS-CoV-2 virus that causes COVID-19. Institutional protocols and algorithms that pertain to the evaluation of patients at risk for COVID-19 are in a state of rapid change based on information released by regulatory bodies including the CDC and federal and state organizations. These policies and algorithms were followed during the patient's care in the ED.   Patient presents with severe left-sided abdominal pain with some tenderness.  Vital signs are unremarkable.  May be a  kidney stone, but with his tenderness, other intra-abdominal pathology is considered we will need to repeat the CT scan today with IV contrast.  Doubt aortic dissection or aneurysm.  Doubt splenic or renal artery occlusion or organ infarction.  Will give IV fluids for hydration, IV Zofran 4 mg for nausea relief and Dilaudid 1 mg IV for pain relief.  Clinical Course as of 06/11/21 1108  Thu Jun 11, 2021  0347 Labs and CT scan of the abdomen and pelvis are all unremarkable.  No evidence of diverticulitis perforation intra-abdominal abscess or kidney stone. [PS]    Clinical Course User Index [PS] Sharman Cheek, MD     -----------------------------------------  11:07 AM on 06/11/2021 ----------------------------------------- Patient feeling better, stable for discharge, recommend close follow-up with PCP.  ____________________________________________   FINAL CLINICAL IMPRESSION(S) / ED DIAGNOSES    Final diagnoses:  Left flank pain     ED Discharge Orders          Ordered    naproxen (NAPROSYN) 500 MG tablet  2 times daily with meals        06/11/21 1106    lidocaine (LIDODERM) 5 %  Every 12 hours        06/11/21 1106            Portions of this note were generated with dragon dictation software. Dictation errors may occur despite best attempts at proofreading.    Sharman Cheek, MD 06/11/21 1108

## 2021-06-11 NOTE — ED Triage Notes (Signed)
C/O left flank pain.  Pain started this morning after waking up.  Had similar symptoms 5 weeks ago, dx with kidney stone.  States pain radiating from left lower back to left groin.

## 2021-06-11 NOTE — Discharge Instructions (Addendum)
Your labs and CT scan today were all okay.  We are not able to find a cause of your pain, but your evaluation is reassuring.  Please follow-up with your doctor this coming week for further evaluation of the symptoms.  Anti-inflammatory medicine such as naproxen and lidocaine patch on the painful area and heating pad can be helpful.

## 2022-04-27 ENCOUNTER — Inpatient Hospital Stay
Admission: EM | Admit: 2022-04-27 | Discharge: 2022-04-29 | DRG: 920 | Disposition: A | Discharge status: Home / Self Care | Payer: No Typology Code available for payment source | Attending: Internal Medicine | Admitting: Internal Medicine

## 2022-04-27 ENCOUNTER — Encounter: Payer: Self-pay | Admitting: Emergency Medicine

## 2022-04-27 DIAGNOSIS — E119 Type 2 diabetes mellitus without complications: Secondary | ICD-10-CM

## 2022-04-27 DIAGNOSIS — K922 Gastrointestinal hemorrhage, unspecified: Secondary | ICD-10-CM | POA: Diagnosis not present

## 2022-04-27 DIAGNOSIS — K625 Hemorrhage of anus and rectum: Secondary | ICD-10-CM

## 2022-04-27 DIAGNOSIS — Z7982 Long term (current) use of aspirin: Secondary | ICD-10-CM

## 2022-04-27 DIAGNOSIS — Z8679 Personal history of other diseases of the circulatory system: Secondary | ICD-10-CM

## 2022-04-27 DIAGNOSIS — K635 Polyp of colon: Secondary | ICD-10-CM | POA: Diagnosis present

## 2022-04-27 DIAGNOSIS — K91840 Postprocedural hemorrhage and hematoma of a digestive system organ or structure following a digestive system procedure: Principal | ICD-10-CM | POA: Diagnosis present

## 2022-04-27 DIAGNOSIS — Y838 Other surgical procedures as the cause of abnormal reaction of the patient, or of later complication, without mention of misadventure at the time of the procedure: Secondary | ICD-10-CM

## 2022-04-27 DIAGNOSIS — Z7984 Long term (current) use of oral hypoglycemic drugs: Secondary | ICD-10-CM

## 2022-04-27 DIAGNOSIS — Z888 Allergy status to other drugs, medicaments and biological substances status: Secondary | ICD-10-CM

## 2022-04-27 DIAGNOSIS — Z87891 Personal history of nicotine dependence: Secondary | ICD-10-CM

## 2022-04-27 DIAGNOSIS — R55 Syncope and collapse: Secondary | ICD-10-CM | POA: Diagnosis present

## 2022-04-27 DIAGNOSIS — Z96659 Presence of unspecified artificial knee joint: Secondary | ICD-10-CM | POA: Diagnosis present

## 2022-04-27 DIAGNOSIS — Z79899 Other long term (current) drug therapy: Secondary | ICD-10-CM

## 2022-04-27 DIAGNOSIS — Z7901 Long term (current) use of anticoagulants: Secondary | ICD-10-CM

## 2022-04-27 DIAGNOSIS — I959 Hypotension, unspecified: Secondary | ICD-10-CM | POA: Diagnosis present

## 2022-04-27 DIAGNOSIS — Z6838 Body mass index (BMI) 38.0-38.9, adult: Secondary | ICD-10-CM

## 2022-04-27 DIAGNOSIS — I10 Essential (primary) hypertension: Secondary | ICD-10-CM | POA: Diagnosis present

## 2022-04-27 DIAGNOSIS — D62 Acute posthemorrhagic anemia: Secondary | ICD-10-CM | POA: Diagnosis present

## 2022-04-27 NOTE — ED Triage Notes (Signed)
Pt presents via POV with complaints of rectal bleeding that started tonight. Pt had a colonoscopy yesterday and had a large polyp removed and was fine until tonight when he's had 3 episodes of bloody stool with associated dizziness. Pt provides an image of bright red blood when using the restroom. Denies CP or SOB.

## 2022-04-28 ENCOUNTER — Inpatient Hospital Stay: Payer: No Typology Code available for payment source | Admitting: Anesthesiology

## 2022-04-28 ENCOUNTER — Encounter
Admission: EM | Disposition: A | Discharge status: Home / Self Care | Payer: Self-pay | Source: Home / Self Care | Attending: Internal Medicine

## 2022-04-28 ENCOUNTER — Other Ambulatory Visit: Payer: Self-pay

## 2022-04-28 ENCOUNTER — Encounter: Payer: Self-pay | Admitting: Family Medicine

## 2022-04-28 ENCOUNTER — Emergency Department: Payer: No Typology Code available for payment source

## 2022-04-28 DIAGNOSIS — Z79899 Other long term (current) drug therapy: Secondary | ICD-10-CM | POA: Diagnosis not present

## 2022-04-28 DIAGNOSIS — E119 Type 2 diabetes mellitus without complications: Secondary | ICD-10-CM

## 2022-04-28 DIAGNOSIS — Z7982 Long term (current) use of aspirin: Secondary | ICD-10-CM | POA: Diagnosis not present

## 2022-04-28 DIAGNOSIS — Z8679 Personal history of other diseases of the circulatory system: Secondary | ICD-10-CM | POA: Diagnosis not present

## 2022-04-28 DIAGNOSIS — K922 Gastrointestinal hemorrhage, unspecified: Secondary | ICD-10-CM | POA: Diagnosis present

## 2022-04-28 DIAGNOSIS — D62 Acute posthemorrhagic anemia: Secondary | ICD-10-CM | POA: Diagnosis present

## 2022-04-28 DIAGNOSIS — I10 Essential (primary) hypertension: Secondary | ICD-10-CM

## 2022-04-28 DIAGNOSIS — I959 Hypotension, unspecified: Secondary | ICD-10-CM | POA: Diagnosis present

## 2022-04-28 DIAGNOSIS — K635 Polyp of colon: Secondary | ICD-10-CM | POA: Diagnosis present

## 2022-04-28 DIAGNOSIS — Z888 Allergy status to other drugs, medicaments and biological substances status: Secondary | ICD-10-CM | POA: Diagnosis not present

## 2022-04-28 DIAGNOSIS — Z87891 Personal history of nicotine dependence: Secondary | ICD-10-CM | POA: Diagnosis not present

## 2022-04-28 DIAGNOSIS — Z7901 Long term (current) use of anticoagulants: Secondary | ICD-10-CM | POA: Diagnosis not present

## 2022-04-28 DIAGNOSIS — Z6838 Body mass index (BMI) 38.0-38.9, adult: Secondary | ICD-10-CM | POA: Diagnosis not present

## 2022-04-28 DIAGNOSIS — Z7984 Long term (current) use of oral hypoglycemic drugs: Secondary | ICD-10-CM | POA: Diagnosis not present

## 2022-04-28 DIAGNOSIS — Z96659 Presence of unspecified artificial knee joint: Secondary | ICD-10-CM | POA: Diagnosis present

## 2022-04-28 DIAGNOSIS — R55 Syncope and collapse: Secondary | ICD-10-CM | POA: Diagnosis present

## 2022-04-28 DIAGNOSIS — K91840 Postprocedural hemorrhage and hematoma of a digestive system organ or structure following a digestive system procedure: Secondary | ICD-10-CM | POA: Diagnosis present

## 2022-04-28 DIAGNOSIS — Y838 Other surgical procedures as the cause of abnormal reaction of the patient, or of later complication, without mention of misadventure at the time of the procedure: Secondary | ICD-10-CM | POA: Diagnosis not present

## 2022-04-28 HISTORY — PX: COLONOSCOPY WITH PROPOFOL: SHX5780

## 2022-04-28 LAB — ABO/RH: ABO/RH(D): O POS

## 2022-04-28 LAB — CBC
HCT: 32.4 % — ABNORMAL LOW (ref 39.0–52.0)
HCT: 34.9 % — ABNORMAL LOW (ref 39.0–52.0)
Hemoglobin: 10.6 g/dL — ABNORMAL LOW (ref 13.0–17.0)
Hemoglobin: 11.3 g/dL — ABNORMAL LOW (ref 13.0–17.0)
MCH: 30.9 pg (ref 26.0–34.0)
MCH: 31 pg (ref 26.0–34.0)
MCHC: 32.4 g/dL (ref 30.0–36.0)
MCHC: 32.7 g/dL (ref 30.0–36.0)
MCV: 94.5 fL (ref 80.0–100.0)
MCV: 95.9 fL (ref 80.0–100.0)
Platelets: 157 10*3/uL (ref 150–400)
Platelets: 198 10*3/uL (ref 150–400)
RBC: 3.43 MIL/uL — ABNORMAL LOW (ref 4.22–5.81)
RBC: 3.64 MIL/uL — ABNORMAL LOW (ref 4.22–5.81)
RDW: 14 % (ref 11.5–15.5)
RDW: 14.7 % (ref 11.5–15.5)
WBC: 7.1 10*3/uL (ref 4.0–10.5)
WBC: 9.3 10*3/uL (ref 4.0–10.5)
nRBC: 0 % (ref 0.0–0.2)
nRBC: 0 % (ref 0.0–0.2)

## 2022-04-28 LAB — COMPREHENSIVE METABOLIC PANEL
ALT: 20 U/L (ref 0–44)
AST: 26 U/L (ref 15–41)
Albumin: 3.1 g/dL — ABNORMAL LOW (ref 3.5–5.0)
Alkaline Phosphatase: 98 U/L (ref 38–126)
Anion gap: 5 (ref 5–15)
BUN: 14 mg/dL (ref 8–23)
CO2: 23 mmol/L (ref 22–32)
Calcium: 8 mg/dL — ABNORMAL LOW (ref 8.9–10.3)
Chloride: 110 mmol/L (ref 98–111)
Creatinine, Ser: 1.09 mg/dL (ref 0.61–1.24)
GFR, Estimated: >60 mL/min (ref >60)
Glucose, Bld: 168 mg/dL — ABNORMAL HIGH (ref 70–99)
Potassium: 3.9 mmol/L (ref 3.5–5.1)
Sodium: 138 mmol/L (ref 135–145)
Total Bilirubin: 0.8 mg/dL (ref 0.3–1.2)
Total Protein: 6 g/dL — ABNORMAL LOW (ref 6.5–8.1)

## 2022-04-28 LAB — GLUCOSE, CAPILLARY: Glucose-Capillary: 103 mg/dL — ABNORMAL HIGH (ref 70–99)

## 2022-04-28 LAB — BASIC METABOLIC PANEL
Anion gap: 4 — ABNORMAL LOW (ref 5–15)
BUN: 18 mg/dL (ref 8–23)
CO2: 24 mmol/L (ref 22–32)
Calcium: 8 mg/dL — ABNORMAL LOW (ref 8.9–10.3)
Chloride: 110 mmol/L (ref 98–111)
Creatinine, Ser: 0.99 mg/dL (ref 0.61–1.24)
GFR, Estimated: >60 mL/min (ref >60)
Glucose, Bld: 115 mg/dL — ABNORMAL HIGH (ref 70–99)
Potassium: 4.1 mmol/L (ref 3.5–5.1)
Sodium: 138 mmol/L (ref 135–145)

## 2022-04-28 LAB — APTT: aPTT: 29 seconds (ref 24–36)

## 2022-04-28 LAB — HEMOGLOBIN AND HEMATOCRIT, BLOOD
HCT: 24.9 % — ABNORMAL LOW (ref 39.0–52.0)
Hemoglobin: 8.1 g/dL — ABNORMAL LOW (ref 13.0–17.0)

## 2022-04-28 LAB — PROTIME-INR
INR: 1.1 (ref 0.8–1.2)
Prothrombin Time: 13.9 seconds (ref 11.4–15.2)

## 2022-04-28 LAB — PREPARE RBC (CROSSMATCH)

## 2022-04-28 SURGERY — COLONOSCOPY WITH PROPOFOL
Anesthesia: General

## 2022-04-28 MED ORDER — LIDOCAINE 5 % EX PTCH
1.0000 | MEDICATED_PATCH | Freq: Two times a day (BID) | CUTANEOUS | Status: DC
Start: 2022-04-28 — End: 2022-04-29
  Filled 2022-04-28 (×2): qty 1

## 2022-04-28 MED ORDER — GLIPIZIDE ER 2.5 MG PO TB24
2.5000 mg | ORAL_TABLET | Freq: Every day | ORAL | Status: DC
  Administered 2022-04-28: 2.5 mg via ORAL
  Filled 2022-04-28 (×2): qty 1

## 2022-04-28 MED ORDER — SODIUM CHLORIDE 0.9% IV SOLUTION
Freq: Once | INTRAVENOUS | Status: AC
Start: 2022-04-28 — End: 2022-04-28

## 2022-04-28 MED ORDER — TRAZODONE HCL 50 MG PO TABS
25.0000 mg | ORAL_TABLET | Freq: Every evening | ORAL | Status: DC | PRN
  Administered 2022-04-28: 25 mg via ORAL
  Filled 2022-04-28: qty 1

## 2022-04-28 MED ORDER — IOHEXOL 350 MG/ML SOLN
100.0000 mL | Freq: Once | INTRAVENOUS | Status: AC | PRN
  Administered 2022-04-28: 100 mL via INTRAVENOUS

## 2022-04-28 MED ORDER — ACETAMINOPHEN 650 MG RE SUPP: 650.0000 mg | Freq: Four times a day (QID) | RECTAL | Status: DC | PRN

## 2022-04-28 MED ORDER — ACETAMINOPHEN 325 MG PO TABS
650.0000 mg | ORAL_TABLET | Freq: Four times a day (QID) | ORAL | Status: DC | PRN
  Administered 2022-04-29: 650 mg via ORAL
  Filled 2022-04-28: qty 2

## 2022-04-28 MED ORDER — PEG 3350-KCL-NA BICARB-NACL 420 G PO SOLR
4000.0000 mL | Freq: Once | ORAL | Status: AC
  Administered 2022-04-28: 4000 mL via ORAL
  Filled 2022-04-28: qty 4000

## 2022-04-28 MED ORDER — SODIUM CHLORIDE 0.9 % IV SOLN: INTRAVENOUS | Status: DC

## 2022-04-28 MED ORDER — INSULIN ASPART 100 UNIT/ML IJ SOLN: 0.0000 [IU] | Freq: Every day | INTRAMUSCULAR | Status: DC

## 2022-04-28 MED ORDER — INSULIN ASPART 100 UNIT/ML IJ SOLN: 0.0000 [IU] | Freq: Three times a day (TID) | INTRAMUSCULAR | Status: DC

## 2022-04-28 MED ORDER — LACTATED RINGERS IV BOLUS
1000.0000 mL | Freq: Once | INTRAVENOUS | Status: AC
  Administered 2022-04-28: 1000 mL via INTRAVENOUS

## 2022-04-28 MED ORDER — ONDANSETRON HCL 4 MG/2ML IJ SOLN: 4.0000 mg | Freq: Four times a day (QID) | INTRAMUSCULAR | Status: DC | PRN

## 2022-04-28 MED ORDER — PROPOFOL 500 MG/50ML IV EMUL
INTRAVENOUS | Status: DC | PRN
  Administered 2022-04-28: 150 ug/kg/min via INTRAVENOUS

## 2022-04-28 MED ORDER — ONDANSETRON HCL 4 MG PO TABS: 4.0000 mg | ORAL_TABLET | Freq: Four times a day (QID) | ORAL | Status: DC | PRN

## 2022-04-28 MED ORDER — LISINOPRIL 5 MG PO TABS
2.5000 mg | ORAL_TABLET | Freq: Every day | ORAL | Status: DC
  Filled 2022-04-28: qty 1

## 2022-04-28 MED ORDER — SODIUM CHLORIDE 0.9 % IV SOLN
INTRAVENOUS | Status: DC
  Administered 2022-04-28: 20 mL/h via INTRAVENOUS

## 2022-04-28 MED ORDER — LORATADINE 10 MG PO TABS
10.0000 mg | ORAL_TABLET | Freq: Every day | ORAL | Status: DC
  Filled 2022-04-28: qty 1

## 2022-04-28 MED ORDER — TRAMADOL HCL 50 MG PO TABS: 50.0000 mg | ORAL_TABLET | Freq: Four times a day (QID) | ORAL | Status: DC | PRN

## 2022-04-28 MED ORDER — PROPOFOL 10 MG/ML IV BOLUS
INTRAVENOUS | Status: DC | PRN
  Administered 2022-04-28: 60 mg via INTRAVENOUS

## 2022-04-28 MED ORDER — LIDOCAINE HCL (CARDIAC) PF 100 MG/5ML IV SOSY
PREFILLED_SYRINGE | INTRAVENOUS | Status: DC | PRN
  Administered 2022-04-28: 100 mg via INTRAVENOUS

## 2022-04-28 NOTE — Consult Note (Signed)
Midge Minium, MD [facility]  [address] [place], [place] [address] Phone: [phone] Fax : [phone]  Consultation  Referring Provider:     Dr. Arville Care Primary Care Physician:  [facility], [facility] Primary Gastroenterologist: GI at the [place]         Reason for Consultation:     Rectal bleeding  Date of Admission:  04/27/2022 Date of Consultation:  04/28/2022         HPI:   Aaron Pham is a 66 y.o. male who reports having a colonoscopy 3 months ago and was found to have a large polyp and was brought back for the polyp removal on Monday.  The patient did not have any bowel movements after the procedure but then yesterday started to have a large amount of rectal bleeding.  The patient then underwent a bleeding scan that did not show any active bleeding despite a significant drop in hemoglobin and passage of a lot of blood from his rectum.  There were clips seen in the cecum indicating that that was the site of the polypectomy.  The colonoscopy report is not obtainable through care everywhere.  The patient denies any abdominal pain nausea vomiting fevers or chills.  Past Medical History:  Diagnosis Date   Diabetes mellitus without complication (HCC)    Hypertension     Past Surgical History:  Procedure Laterality Date   ANKLE SURGERY     COLON SURGERY     REPLACEMENT TOTAL KNEE     ROTATOR CUFF REPAIR      Prior to Admission medications   Medication Sig Start Date End Date Taking? Authorizing Provider  aspirin 81 MG tablet Take 81 mg by mouth daily.    [provider]  cetirizine (ZYRTEC) 10 MG tablet Take 10 mg by mouth daily.    [provider]  CHLORTHALIDONE PO Take 0.5 tablets by mouth daily.    [provider]  GLIPIZIDE PO Take 0.5 tablets by mouth 2 (two) times daily.    [provider]  lidocaine (LIDODERM) 5 % Place 1 patch onto the skin every 12 (twelve) hours. Remove & Discard patch within 12 hours or as directed by MD  06/11/21   Sharman Cheek, MD  LISINOPRIL PO Take 1 tablet by mouth daily.    [provider]  metFORMIN (GLUCOPHAGE) 1000 MG tablet Take 1,000 mg by mouth 2 (two) times daily with a meal.    [provider]  naproxen (NAPROSYN) 500 MG tablet Take 1 tablet (500 mg total) by mouth 2 (two) times daily with a meal. 06/11/21   Sharman Cheek, MD  traMADol (ULTRAM) 50 MG tablet Take 1 tablet (50 mg total) by mouth every 6 (six) hours as needed. 04/28/21 04/28/22  Jene Every, MD    History reviewed. No pertinent family history.   Social History   Tobacco Use   Smoking status: Former    Types: Cigarettes   Smokeless tobacco: Never  Vaping Use   Vaping Use: Never used  Substance Use Topics   Alcohol use: Yes   Drug use: No    Allergies as of 04/27/2022 - Review Complete 04/27/2022  Allergen Reaction Noted   Hydroxyzine Rash 07/27/2015    Review of Systems:    All systems reviewed and negative except where noted in HPI.   Physical Exam:  Vital signs in last 24 hours: Temp:  [97.2 F (36.2 C)-97.9 F (36.6 C)] 97.2 F (36.2 C) (08/16  0420) Pulse Rate:  [78-107] 100 (08/16 0630) Resp:  [11-23] 16 (08/16 0630) BP: (78-131)/(59-86) 86/64 (08/16 0630) SpO2:  [99 %-100 %] 100 % (08/16 0630) Weight:  [104.3 kg] 104.3 kg (08/15 2352)   General:   Pleasant, cooperative in NAD Head:  Normocephalic and atraumatic. Eyes:   No icterus.   Conjunctiva pink. PERRLA. Ears:  Normal auditory acuity. Neck:  Supple; no masses or thyroidomegaly Lungs: Respirations even and unlabored. Lungs clear to auscultation bilaterally.   No wheezes, crackles, or rhonchi.  Heart:  Regular rate and rhythm;  Without murmur, clicks, rubs or gallops Abdomen:  Soft, nondistended, nontender. Normal bowel sounds. No appreciable masses or hepatomegaly.  No rebound or guarding.  Rectal:  Not performed. Msk:  Symmetrical without gross deformities.    Extremities:  Without edema, cyanosis or  clubbing. Neurologic:  Alert and oriented x3;  grossly normal neurologically. Skin:  Intact without significant lesions or rashes. Cervical Nodes:  No significant cervical adenopathy. Psych:  Alert and cooperative. Normal affect.  LAB RESULTS: Recent Labs    04/27/22 2355 04/28/22 0142 04/28/22 0608  WBC 9.3  --  7.1  HGB 11.3* 8.1* 10.6*  HCT 34.9* 24.9* 32.4*  PLT 198  --  157   BMET Recent Labs    04/27/22 2355 04/28/22 0608  NA 138 138  K 3.9 4.1  CL 110 110  CO2 23 24  GLUCOSE 168* 115*  BUN 14 18  CREATININE 1.09 0.99  CALCIUM 8.0* 8.0*   LFT Recent Labs    04/27/22 2355  PROT 6.0*  ALBUMIN 3.1*  AST 26  ALT 20  ALKPHOS 98  BILITOT 0.8   PT/INR Recent Labs    04/28/22 0030  LABPROT 13.9  INR 1.1    STUDIES: CT ANGIO GI BLEED  Result Date: 04/28/2022 CLINICAL DATA:  Recent colonoscopy with large polyp removal, suspect active lower GI bleeding EXAM: CTA ABDOMEN AND PELVIS WITHOUT AND WITH CONTRAST TECHNIQUE: Multidetector CT imaging of the abdomen and pelvis was performed using the standard protocol during bolus administration of intravenous contrast. Multiplanar reconstructed images and MIPs were obtained and reviewed to evaluate the vascular anatomy. RADIATION DOSE REDUCTION: This exam was performed according to the departmental dose-optimization program which includes automated exposure control, adjustment of the mA and/or kV according to patient size and/or use of iterative reconstruction technique. CONTRAST:  OMNIPAQUE IOHEXOL 350 MG/ML SOLN COMPARISON:  CT abdomen and pelvis 06/11/2021. FINDINGS: VASCULAR Aorta: Atherosclerosis without significant narrowing dissection, or aneurysm. Celiac: Mild narrowing at the origin secondary to atherosclerotic plaque. SMA: Mild narrowing at the proximal SMA secondary to atherosclerotic plaque. Renals: Single left renal artery is widely patent. Dominant right renal and small accessory right renal arteries are  widely patent. IMA: Patent without evidence of aneurysm, dissection, vasculitis or significant stenosis. Inflow: Scattered atherosclerotic plaque. No significant narrowing, dissection, or aneurysm. Proximal Outflow: Scattered atherosclerotic plaque. No significant narrowing, dissection, or aneurysm. Veins: Unremarkable. Review of the MIP images confirms the above findings. NON-VASCULAR Lower chest: No acute abnormality. Hepatobiliary: No suspicious focal liver abnormality is seen. No gallstones, gallbladder wall thickening, or biliary dilatation. Pancreas: Unremarkable. No pancreatic ductal dilatation or surrounding inflammatory changes. Spleen: Normal in size without focal abnormality. Adrenals/Urinary Tract: Adrenal glands are unremarkable. Kidneys are normal, without renal calculi, suspicious focal lesion, or hydronephrosis. Unchanged benign-appearing renal cysts. Bladder is unremarkable. Stomach/Bowel: Metallic clips in the cecum compatible with history of polyp removal. No evidence of active bleeding in this area or elsewhere  within the small bowel or colon. Stomach is within normal limits. The appendix is normal. No evidence of bowel wall thickening, distention, or inflammatory changes. Unchanged fat density lesion along the anterior margin of the rectum. Lymphatic: No enlarged abdominal or pelvic lymph nodes. Reproductive: Unremarkable. Other: No free intraperitoneal fluid or air. Musculoskeletal: No acute or significant osseous findings. Fat containing left inguinal hernia. IMPRESSION: VASCULAR No evidence of active bleeding in the abdomen or pelvis. NON-VASCULAR Metallic clips in the cecum likely related to polyp removal. No acute abnormality in the abdomen or pelvis. Electronically Signed   By: Minerva Fester M.D.   On: 04/28/2022 01:30      Impression / Plan:   Assessment: Principal Problem:   GI bleeding Active Problems:   Acute postoperative anemia due to expected blood loss   Essential  hypertension   Type 2 diabetes mellitus without complications (HCC)   Heaton Sarin is a 66 y.o. y/o male with post polypectomy bleeding from a large cecal lesion removed on Monday.  The patient had previously been on anticoagulation but no longer takes anticoagulation.  The patient was hypotensive and has been given blood and fluids and resuscitated well.  The patient's CT angiography was negative for any sign of bleeding but there was a clip seen in the cecum indicating that was the site of the polyp removal.  Plan:  The patient will be given a prep for a colonoscopy for today.  The patient has been told the importance of drinking the prep so that we can get the colonoscopy done.  He has been explained the risks of perforation since the cecum is the thinnest part of the colon and prone to perforation with any further intervention after polypectomy.  The patient has also been told that we can watch and see if he bleeds again although the likelihood would be high that he would bleed again and since it was such a significant bleed can result in worsening of his hemodynamics.  He has also been told that surgery is an option if the bleeding continues and cannot be stopped.  The patient has been explained the plan and agrees with it.  Thank you for involving me in the care of this patient.      LOS: 0 days   Midge Minium, MD, [facility] 04/28/2022, 7:05 AM,  Pager [phone] 7am-5pm  Check AMION for 5pm -7am coverage and on weekends   Note: This dictation was prepared with Dragon dictation along with smaller phrase technology. Any transcriptional errors that result from this process are unintentional.

## 2022-04-28 NOTE — H&P (Signed)
[place]   PATIENT NAME: Aaron Pham    MR#:  [id number]  DATE OF BIRTH:  1956-06-26  DATE OF ADMISSION:  04/27/2022  PRIMARY CARE PHYSICIAN: [facility] [place]   Patient is coming from: Home  REQUESTING/REFERRING PHYSICIAN: Vladimir Crofts, MD  CHIEF COMPLAINT:   Chief Complaint  Patient presents with   Rectal Bleeding    HISTORY OF PRESENT ILLNESS:  Aaron Pham is a 66 y.o. African-American male with medical history significant for Dr. Ayesha Rumpf mellitus and hypertension, who presented to the emergency room with a [place] of lower GI bleeding.  The patient underwent a colonoscopy on Monday at the [place] with excision of a big polyp.  The patient started bleeding on Tuesday night.  He has been experiencing dizziness and lightheadedness.  He was noted to be tachycardic.  No nausea or vomiting or heartburn.  He has been having bright red blood as well as stools mixed with blood.  No melena.  No chest pain or palpitations.  No cough or wheezing or hemoptysis.  No dysuria, oliguria or hematuria or flank pain.  No other bleeding diathesis.  ED Course: Upon presentation to the emergency room, BP was 93/63 and heart rate 114 with otherwise normal vital signs.  Labs revealed a blood Leukos of 168 and total protein of 6.  CBC showed hemoglobin of 11.3 hematocrit 34.9.  Repeat H&H were 8.1 and 24.9.  Blood group was O+ with negative antibody screen. EKG as reviewed by me : EKG showed sinus tachycardia with rate 109 with PACs and low voltage QRS.  The patient was typed and crossmatched will be transfused 2 units of packed red blood cells.  He was given 1 L bolus of IV lactated Ringer.  Dr. Allen Norris was notified about the patient.  He will be admitted to a progressive unit bed for further evaluation and management. PAST MEDICAL HISTORY:   Past Medical History:  Diagnosis Date   Diabetes mellitus without complication ([place])    Hypertension     PAST SURGICAL HISTORY:    Past Surgical History:  Procedure Laterality Date   ANKLE SURGERY     COLON SURGERY     REPLACEMENT TOTAL KNEE     ROTATOR CUFF REPAIR      SOCIAL HISTORY:   Social History   Tobacco Use   Smoking status: Former    Types: Cigarettes   Smokeless tobacco: Never  Substance Use Topics   Alcohol use: Yes    FAMILY HISTORY:  Positive for cancer  DRUG ALLERGIES:   Allergies  Allergen Reactions   Hydroxyzine Rash    REVIEW OF SYSTEMS:   ROS As per history of present illness. All pertinent systems were reviewed above. Constitutional, HEENT, cardiovascular, respiratory, GI, GU, musculoskeletal, neuro, psychiatric, endocrine, integumentary and hematologic systems were reviewed and are otherwise negative/unremarkable except for positive findings mentioned above in the HPI.   MEDICATIONS AT HOME:   Prior to Admission medications   Medication Sig Start Date End Date Taking? Authorizing Provider  aspirin 81 MG tablet Take 81 mg by mouth daily.    [provider]  cetirizine (ZYRTEC) 10 MG tablet Take 10 mg by mouth daily.    [provider]  CHLORTHALIDONE PO Take 0.5 tablets by mouth daily.    [provider]  GLIPIZIDE PO Take 0.5 tablets by mouth 2 (two) times daily.    [provider]  lidocaine (LIDODERM) 5 % Place 1 patch onto  the skin every 12 (twelve) hours. Remove & Discard patch within 12 hours or as directed by MD 06/11/21   Sharman Cheek, MD  LISINOPRIL PO Take 1 tablet by mouth daily.    [provider]  metFORMIN (GLUCOPHAGE) 1000 MG tablet Take 1,000 mg by mouth 2 (two) times daily with a meal.    [provider]  naproxen (NAPROSYN) 500 MG tablet Take 1 tablet (500 mg total) by mouth 2 (two) times daily with a meal. 06/11/21   Sharman Cheek, MD  traMADol (ULTRAM) 50 MG tablet Take 1 tablet (50 mg total) by mouth every 6 (six) hours as needed. 04/28/21 04/28/22  Jene Every, MD      VITAL SIGNS:   Blood pressure (!) 88/61, pulse 91, temperature (!) 97.2 F (36.2 C), resp. rate 20, height 5\' 6"  (1.676 m), weight 104.3 kg, SpO2 99 %.  PHYSICAL EXAMINATION:  Physical Exam  GENERAL:  66 y.o.-year-old African-American male patient lying in the bed with no acute distress.  EYES: Pupils equal, round, reactive to light and accommodation. No scleral icterus.  Mild pallor.  Extraocular muscles intact.  HEENT: Head atraumatic, normocephalic. Oropharynx and nasopharynx clear.  NECK:  Supple, no jugular venous distention. No thyroid enlargement, no tenderness.  LUNGS: Normal breath sounds bilaterally, no wheezing, rales,rhonchi or crepitation. No use of accessory muscles of respiration.  CARDIOVASCULAR: Regular rate and rhythm, S1, S2 normal. No murmurs, rubs, or gallops.  ABDOMEN: Soft, nondistended, nontender. Bowel sounds present. No organomegaly or mass.  EXTREMITIES: No pedal edema, cyanosis, or clubbing.  NEUROLOGIC: Cranial nerves II through XII are intact. Muscle strength 5/5 in all extremities. Sensation intact. Gait not checked.  PSYCHIATRIC: The patient is alert and oriented x 3.  Normal affect and good eye contact. SKIN: No obvious rash, lesion, or ulcer.   LABORATORY PANEL:   CBC Recent Labs  Lab 04/28/22 0608  WBC 7.1  HGB 10.6*  HCT 32.4*  PLT 157   ------------------------------------------------------------------------------------------------------------------  Chemistries  Recent Labs  Lab 04/27/22 2355  NA 138  K 3.9  CL 110  CO2 23  GLUCOSE 168*  BUN 14  CREATININE 1.09  CALCIUM 8.0*  AST 26  ALT 20  ALKPHOS 98  BILITOT 0.8   ------------------------------------------------------------------------------------------------------------------  Cardiac Enzymes No results for input(s): "TROPONINI" in the last 168 hours. ------------------------------------------------------------------------------------------------------------------  RADIOLOGY:  CT  ANGIO GI BLEED  Result Date: 04/28/2022 CLINICAL DATA:  Recent colonoscopy with large polyp removal, suspect active lower GI bleeding EXAM: CTA ABDOMEN AND PELVIS WITHOUT AND WITH CONTRAST TECHNIQUE: Multidetector CT imaging of the abdomen and pelvis was performed using the standard protocol during bolus administration of intravenous contrast. Multiplanar reconstructed images and MIPs were obtained and reviewed to evaluate the vascular anatomy. RADIATION DOSE REDUCTION: This exam was performed according to the departmental dose-optimization program which includes automated exposure control, adjustment of the mA and/or kV according to patient size and/or use of iterative reconstruction technique. CONTRAST:  04/30/2022 OMNIPAQUE IOHEXOL 350 MG/ML SOLN COMPARISON:  CT abdomen and pelvis 06/11/2021. FINDINGS: VASCULAR Aorta: Atherosclerosis without significant narrowing dissection, or aneurysm. Celiac: Mild narrowing at the origin secondary to atherosclerotic plaque. SMA: Mild narrowing at the proximal SMA secondary to atherosclerotic plaque. Renals: Single left renal artery is widely patent. Dominant right renal and small accessory right renal arteries are widely patent. IMA: Patent without evidence of aneurysm, dissection, vasculitis or significant stenosis. Inflow: Scattered atherosclerotic plaque. No significant narrowing, dissection, or aneurysm. Proximal Outflow: Scattered atherosclerotic plaque. No significant narrowing,  dissection, or aneurysm. Veins: Unremarkable. Review of the MIP images confirms the above findings. NON-VASCULAR Lower chest: No acute abnormality. Hepatobiliary: No suspicious focal liver abnormality is seen. No gallstones, gallbladder wall thickening, or biliary dilatation. Pancreas: Unremarkable. No pancreatic ductal dilatation or surrounding inflammatory changes. Spleen: Normal in size without focal abnormality. Adrenals/Urinary Tract: Adrenal glands are unremarkable. Kidneys are normal, without  renal calculi, suspicious focal lesion, or hydronephrosis. Unchanged benign-appearing renal cysts. Bladder is unremarkable. Stomach/Bowel: Metallic clips in the cecum compatible with history of polyp removal. No evidence of active bleeding in this area or elsewhere within the small bowel or colon. Stomach is within normal limits. The appendix is normal. No evidence of bowel wall thickening, distention, or inflammatory changes. Unchanged fat density lesion along the anterior margin of the rectum. Lymphatic: No enlarged abdominal or pelvic lymph nodes. Reproductive: Unremarkable. Other: No free intraperitoneal fluid or air. Musculoskeletal: No acute or significant osseous findings. Fat containing left inguinal hernia. IMPRESSION: VASCULAR No evidence of active bleeding in the abdomen or pelvis. NON-VASCULAR Metallic clips in the cecum likely related to polyp removal. No acute abnormality in the abdomen or pelvis. Electronically Signed   By: Minerva Fester M.D.   On: 04/28/2022 01:30      IMPRESSION AND PLAN:  Assessment and Plan: * GI bleeding - This is a postprocedural GI bleeding after resection of colonic polyp. - The patient be admitted to a progressive unit bed. -He was typed and crossmatched for 4 units of PRBCs and will be transfused 2units.. - We will stop aspirin. - The patient is off Eliquis for the last month. - We will follow posttransfusion H&H as well as serial levels. - GI consultation will be obtained. - Dr. Servando Snare was notified about the patient.   Acute postoperative anemia due to expected blood loss - Management as above. - The patient will be hydrated with IV normal saline.  Type 2 diabetes mellitus without complications (HCC) - The patient will be placed on supplement coverage with NovoLog. - We will continue his glipizide. - We will hold off metformin.  Essential hypertension - We will continue his antihypertensives while holding off diuretics.   DVT prophylaxis:  SCDs. Advanced Care Planning:  Code Status: full code.  Family Communication:  The plan of care was discussed in details with the patient (and family). I answered all questions. The patient agreed to proceed with the above mentioned plan. Further management will depend upon hospital course. Disposition Plan: Back to previous home environment Consults called: Gastroenterology. All the records are reviewed and case discussed with ED provider.  Status is: Inpatient    At the time of the admission, it appears that the appropriate admission status for this patient is inpatient.  This is judged to be reasonable and necessary in order to provide the required intensity of service to ensure the patient's safety given the presenting symptoms, physical exam findings and initial radiographic and laboratory data in the context of comorbid conditions.  The patient requires inpatient status due to high intensity of service, high risk of further deterioration and high frequency of surveillance required.  I certify that at the time of admission, it is my clinical judgment that the patient will require inpatient hospital care extending more than 2 midnights.                            Dispo: The patient is from: Home  Anticipated d/c is to: Home              Patient currently is not medically stable to d/c.              Difficult to place patient: No  Christel Mormon M.D on 04/28/2022 at 6:32 AM  [facility]   From 7 PM-7 AM, contact night-coverage [email or web address]  CC: Primary care physician; [facility]

## 2022-04-28 NOTE — Assessment & Plan Note (Addendum)
Meets criteria for BMI greater than 35 and comorbidity of hypertension.  Ozempic on hold

## 2022-04-28 NOTE — ED Notes (Signed)
Pt had large BM that was bright red blood. Pt back in bed VS obtained and stable at this time.

## 2022-04-28 NOTE — Hospital Course (Addendum)
Patient is a 66 year old African-American male past medical history of diabetes mellitus and hypertension who presented to the emergency department on the night of 8/15 with complaints of lower GI bleeding.  Patient had had a colonoscopy at the [facility] in [place] on 8/14 at that time had removal of a large polyp.  Rectal bleeding had started on 8/15 night.  In the emergency room, patient found to be hypotensive and tachycardic.  Although his hemoglobin was 11.3 on arrival to the emergency department, repeat labs less than 2 hours later had dropped down to 8.1.  Patient transfused 2 units packed red blood cells and by morning of 8/16, hemoglobin had increased to 10.6.  Patient seen by gastroenterology, and taken for colonoscopy on 8/16.  Patient had 3 polyps removed in the transverse and ascending colon.  Noted to have some bleeding in the cecum secondary to previous polypectomy and patient had 3 hemostasis clips placed.

## 2022-04-28 NOTE — ED Provider Notes (Signed)
[facility] Provider Note    Event Date/Time   First MD Initiated Contact with Patient 04/28/22 0004     (approximate)   History   Rectal Bleeding   HPI  Chavis Tessler is a 66 y.o. male who presents to the ED for evaluation of Rectal Bleeding   I attempted to review [facility].  Previous history of atrial fibrillation on apixaban, but patient self reports he longer takes this medication for the past 1 month..   Patient reports having a large colonic polyp removed via colonoscopy on Monday 8/14.  This was through the [place].  Reports they had to "call in someone special to get out a really big polyp."  Reports just 1 large polyp removal, uncertain what portion of his colon this was located.  No bowel movements until earlier this evening, Tuesday night, and he had frank bright red blood per rectum.  Reports 4-5 episodes of large-volume blood this evening over the past few hours.  Reports a rolling sensation in the stomach prior to these episodes, but no abdominal pain, emesis or fever.  No hematuria.  Presyncope but no syncope or falls.   Physical Exam   Triage Vital Signs: ED Triage Vitals  Enc Vitals Group     BP 04/27/22 2353 93/63     Pulse Rate 04/27/22 2353 83     Resp 04/27/22 2353 18     Temp 04/27/22 2353 97.9 F (36.6 C)     Temp Source 04/27/22 2353 Oral     SpO2 04/27/22 2353 100 %     Weight 04/27/22 2352 230 lb (104.3 kg)     Height 04/27/22 2352 5\' 6"  (1.676 m)     Head Circumference --      Peak Flow --      Pain Score 04/27/22 2352 0     Pain Loc --      Pain Edu? --      Excl. in GC? --     Most recent vital signs: Vitals:   04/28/22 0150 04/28/22 0222  BP: 125/86 125/86  Pulse: 100 94  Resp: 13 16  Temp:    SpO2: 100% 100%    General: Awake, no distress.  Looks well, pleasant and conversational. CV:  Good peripheral perfusion.  Resp:  Normal effort.  Abd:  No distention.  Soft and benign MSK:  No  deformity noted.  Neuro:  No focal deficits appreciated. Other:     ED Results / Procedures / Treatments   Labs (all labs ordered are listed, but only abnormal results are displayed) Labs Reviewed  COMPREHENSIVE METABOLIC PANEL - Abnormal; Notable for the following components:      Result Value   Glucose, Bld 168 (*)    Calcium 8.0 (*)    Total Protein 6.0 (*)    Albumin 3.1 (*)    All other components within normal limits  CBC - Abnormal; Notable for the following components:   RBC 3.64 (*)    Hemoglobin 11.3 (*)    HCT 34.9 (*)    All other components within normal limits  HEMOGLOBIN AND HEMATOCRIT, BLOOD - Abnormal; Notable for the following components:   Hemoglobin 8.1 (*)    HCT 24.9 (*)    All other components within normal limits  PROTIME-INR  APTT  HIV ANTIBODY (ROUTINE TESTING W REFLEX)  BASIC METABOLIC PANEL  CBC  HEMOGLOBIN AND HEMATOCRIT, BLOOD  HEMOGLOBIN AND HEMATOCRIT, BLOOD  TYPE AND SCREEN  TYPE AND SCREEN  PREPARE RBC (CROSSMATCH)  ABO/RH    EKG Sinus rhythm with sinus arrhythmia, rate of 109 bpm.  Normal axis and intervals.  No clear signs of acute ischemia.  RADIOLOGY CTA GI bleed without evidence of active extravasation  Official radiology report(s): CT ANGIO GI BLEED  Result Date: 04/28/2022 CLINICAL DATA:  Recent colonoscopy with large polyp removal, suspect active lower GI bleeding EXAM: CTA ABDOMEN AND PELVIS WITHOUT AND WITH CONTRAST TECHNIQUE: Multidetector CT imaging of the abdomen and pelvis was performed using the standard protocol during bolus administration of intravenous contrast. Multiplanar reconstructed images and MIPs were obtained and reviewed to evaluate the vascular anatomy. RADIATION DOSE REDUCTION: This exam was performed according to the departmental dose-optimization program which includes automated exposure control, adjustment of the mA and/or kV according to patient size and/or use of iterative reconstruction technique.  CONTRAST:  OMNIPAQUE IOHEXOL 350 MG/ML SOLN COMPARISON:  CT abdomen and pelvis 06/11/2021. FINDINGS: VASCULAR Aorta: Atherosclerosis without significant narrowing dissection, or aneurysm. Celiac: Mild narrowing at the origin secondary to atherosclerotic plaque. SMA: Mild narrowing at the proximal SMA secondary to atherosclerotic plaque. Renals: Single left renal artery is widely patent. Dominant right renal and small accessory right renal arteries are widely patent. IMA: Patent without evidence of aneurysm, dissection, vasculitis or significant stenosis. Inflow: Scattered atherosclerotic plaque. No significant narrowing, dissection, or aneurysm. Proximal Outflow: Scattered atherosclerotic plaque. No significant narrowing, dissection, or aneurysm. Veins: Unremarkable. Review of the MIP images confirms the above findings. NON-VASCULAR Lower chest: No acute abnormality. Hepatobiliary: No suspicious focal liver abnormality is seen. No gallstones, gallbladder wall thickening, or biliary dilatation. Pancreas: Unremarkable. No pancreatic ductal dilatation or surrounding inflammatory changes. Spleen: Normal in size without focal abnormality. Adrenals/Urinary Tract: Adrenal glands are unremarkable. Kidneys are normal, without renal calculi, suspicious focal lesion, or hydronephrosis. Unchanged benign-appearing renal cysts. Bladder is unremarkable. Stomach/Bowel: Metallic clips in the cecum compatible with history of polyp removal. No evidence of active bleeding in this area or elsewhere within the small bowel or colon. Stomach is within normal limits. The appendix is normal. No evidence of bowel wall thickening, distention, or inflammatory changes. Unchanged fat density lesion along the anterior margin of the rectum. Lymphatic: No enlarged abdominal or pelvic lymph nodes. Reproductive: Unremarkable. Other: No free intraperitoneal fluid or air. Musculoskeletal: No acute or significant osseous findings. Fat containing  left inguinal hernia. IMPRESSION: VASCULAR No evidence of active bleeding in the abdomen or pelvis. NON-VASCULAR Metallic clips in the cecum likely related to polyp removal. No acute abnormality in the abdomen or pelvis. Electronically Signed   By: Minerva Fester M.D.   On: 04/28/2022 01:30    PROCEDURES and INTERVENTIONS:  .1-3 Lead EKG Interpretation  Performed by: Delton Prairie, MD Authorized by: Delton Prairie, MD     Interpretation: abnormal     ECG rate:  110   ECG rate assessment: tachycardic     Rhythm: sinus tachycardia     Ectopy: none     Conduction: normal   .Critical Care  Performed by: Delton Prairie, MD Authorized by: Delton Prairie, MD   Critical care provider statement:    Critical care time (minutes):  30   Critical care time was exclusive of:  Separately billable procedures and treating other patients   Critical care was necessary to treat or prevent imminent or life-threatening deterioration of the following conditions:  Circulatory failure   Critical care was time spent personally by me on the following activities:  Development of treatment  plan with patient or surrogate, discussions with consultants, evaluation of patient's response to treatment, examination of patient, ordering and review of laboratory studies, ordering and review of radiographic studies, ordering and performing treatments and interventions, pulse oximetry, re-evaluation of patient's condition and review of old charts   Medications  traMADol (ULTRAM) tablet 50 mg (has no administration in time range)  lisinopril (ZESTRIL) tablet 2.5 mg (has no administration in time range)  glipiZIDE (GLUCOTROL XL) 24 hr tablet 2.5 mg (has no administration in time range)  loratadine (CLARITIN) tablet 10 mg (has no administration in time range)  lidocaine (LIDODERM) 5 % 1 patch (has no administration in time range)  0.9 %  sodium chloride infusion (has no administration in time range)  acetaminophen (TYLENOL) tablet  650 mg (has no administration in time range)    Or  acetaminophen (TYLENOL) suppository 650 mg (has no administration in time range)  traZODone (DESYREL) tablet 25 mg (has no administration in time range)  ondansetron (ZOFRAN) tablet 4 mg (has no administration in time range)    Or  ondansetron (ZOFRAN) injection 4 mg (has no administration in time range)  0.9 %  sodium chloride infusion (Manually program via Guardrails IV Fluids) (has no administration in time range)  lactated ringers bolus 1,000 mL (0 mLs Intravenous Stopped 04/28/22 0141)  iohexol (OMNIPAQUE) 350 MG/ML injection 100 mL (100 mLs Intravenous Contrast Given 04/28/22 0101)     IMPRESSION / MDM / ASSESSMENT AND PLAN / ED COURSE  I reviewed the triage vital signs and the nursing notes.  Differential diagnosis includes, but is not limited to, bleeding hemorrhoid, perforated viscus, bleeding polyp, AVM  {Patient presents with symptoms of an acute illness or injury that is potentially life-threatening.  66 year old male presents to the ED with postprocedural lower GI bleeding requiring admission and blood transfusions.  He presents tachycardic with soft pressures in the setting of active hematochezia.  Has multiple episodes in the ED.  No syncope.  Improving hemodynamics with IV fluids.  Initial CBC with hemoglobin of 11.3 without comparison within our system.  Repeat after just 1.5-2 hours down to 8.1.  In consultation with hospitalist, we will initiate blood transfusions as this returns around the time of admission.  Otherwise his blood work demonstrates no significant renal dysfunction.  CTA GI bleed without signs of active extravasation.  We will admit for blood transfusion, resuscitation, cleanout and colonoscopy  Clinical Course as of 04/28/22 0228  Wed Apr 28, 2022  0025 Large bloody BM [DS]  0027 GI paged [DS]  0033 I consult with Dr. Servando Snare, he recommends CT angio GI bleed to assess location of possible bleed considering do  not have documentation of his procedure..  Colonoscopy would be difficult because patient was not prepped. [DS]  8841 I consult with hospitalist who agrees to admit. [DS]    Clinical Course User Index [DS] Delton Prairie, MD     FINAL CLINICAL IMPRESSION(S) / ED DIAGNOSES   Final diagnoses:  Lower GI bleed  Acute GI bleeding  Rectal bleeding     Rx / DC Orders   ED Discharge Orders     None        Note:  This document was prepared using Dragon voice recognition software and may include unintentional dictation errors.   Delton Prairie, MD 04/28/22 0230

## 2022-04-28 NOTE — Assessment & Plan Note (Addendum)
-   This is a postprocedural GI bleeding after resection of colonic polyp.  Transfused 2 units packed red blood cells.  Has been off of Eliquis for the last month and aspirin on hold.  GI took patient for colonoscopy and 3 hemostasis clips placed at area of previous polypectomy.  Hemoglobin had remained stable by following day and patient safe for discharge

## 2022-04-28 NOTE — Assessment & Plan Note (Signed)
Diuretics held during hospitalization, resumed on discharge

## 2022-04-28 NOTE — Transfer of Care (Signed)
Immediate Anesthesia Transfer of Care Note  Patient: [name]  Procedure(s) Performed: COLONOSCOPY WITH PROPOFOL  Patient Location: PACU  Anesthesia Type:General  Level of Consciousness: awake and drowsy  Airway & Oxygen Therapy: Patient Spontanous Breathing  Post-op Assessment: Report given to RN and Post -op Vital signs reviewed and stable  Post vital signs: Reviewed and stable  Last Vitals:  Vitals Value Taken Time  BP 115/77 04/28/22 1250  Temp    Pulse 91 04/28/22 1250  Resp 25 04/28/22 1250  SpO2 99 % 04/28/22 1250    Last Pain:  Vitals:   04/28/22 1115  TempSrc: Oral  PainSc:          Complications: No notable events documented.

## 2022-04-28 NOTE — ED Notes (Signed)
Followed up w/ Blood Bank regarding 2nd unit d/t not being informed it was ready.  Blood Bank reports both units were gotten ready at the same time.

## 2022-04-28 NOTE — Assessment & Plan Note (Signed)
-   Management as above. - The patient will be hydrated with IV normal saline.

## 2022-04-28 NOTE — ED Notes (Signed)
Endo made aware the Pt is ready for transport.  Consent placed at the bedside.

## 2022-04-28 NOTE — Op Note (Addendum)
[facility] Gastroenterology Patient Name: Aaron Pham Procedure Date: 04/28/2022 11:59 AM MRN: [id number] Account #: [id number] Date of Birth: 06-Oct-1955 Admit Type: Outpatient Age: 66 Room: [facility] ENDO ROOM 1 Gender: Male Note Status: Finalized Instrument Name: Nelda Marseille [id number] Procedure:             Colonoscopy Indications:           Hematochezia Providers:             Midge Minium MD, MD Referring MD:          [facility], MD (Referring MD) Medicines:             Propofol per Anesthesia Complications:         No immediate complications. Procedure:             Pre-Anesthesia Assessment:                        - Prior to the procedure, a History and Physical was                         performed, and patient medications and allergies were                         reviewed. The patient's tolerance of previous                         anesthesia was also reviewed. The risks and benefits                         of the procedure and the sedation options and risks                         were discussed with the patient. All questions were                         answered, and informed consent was obtained. Prior                         Anticoagulants: The patient has taken no previous                         anticoagulant or antiplatelet agents. ASA Grade                         Assessment: II - A patient with mild systemic disease.                         After reviewing the risks and benefits, the patient                         was deemed in satisfactory condition to undergo the                         procedure.                        After obtaining informed consent, the colonoscope was  passed under direct vision. Throughout the procedure,                         the patient's blood pressure, pulse, and oxygen                         saturations were monitored continuously. The                         Colonoscope was introduced through the  anus and                         advanced to the the cecum, identified by appendiceal                         orifice and ileocecal valve. The colonoscopy was                         performed without difficulty. The patient tolerated                         the procedure well. The quality of the bowel                         preparation was excellent. Findings:      The perianal and digital rectal examinations were normal.      A 3 mm polyp was found in the transverse colon. The polyp was sessile.       The polyp was removed with a cold biopsy forceps. Resection and       retrieval were complete.      Two sessile polyps were found in the ascending colon. The polyps were 3       to 5 mm in size. These polyps were removed with a cold biopsy forceps.       Resection and retrieval were complete.      Four clips were seen in the cecum with a vessel at the center.      Stigmata of recent bleeding was seen in the cecum, secondary to previous       polypectomy procedure. For hemostasis, three hemostatic clips were       successfully placed (MR conditional). There was no bleeding at the end       of the procedure.      Non-bleeding internal hemorrhoids were found during retroflexion. The       hemorrhoids were Grade I (internal hemorrhoids that do not prolapse). Impression:            - One 3 mm polyp in the transverse colon, removed with                         a cold biopsy forceps. Resected and retrieved.                        - Two 3 to 5 mm polyps in the ascending colon, removed                         with a cold biopsy forceps. Resected and retrieved.                        -  Four clips were seen in the cecum with a vessel at                         the center.                        - Bleeding in the cecum secondary to previous                         polypectomy. Clips (MR conditional) were placed.                        - Non-bleeding internal hemorrhoids. Recommendation:        -  Return patient to hospital ward for ongoing care.                        - Clear liquid diet.                        - Continue present medications. Procedure Code(s):     --- Professional ---                        [phone], 59, Colonoscopy, flexible; with control of                         bleeding, any method                        45380, Colonoscopy, flexible; with biopsy, single or                         multiple Diagnosis Code(s):     --- Professional ---                        E33.295, Postprocedural hemorrhage of a digestive                         system organ or structure following a digestive system                         procedure                        K63.5, Polyp of colon                        K92.1, Melena (includes Hematochezia) CPT copyright 2019 American Medical Association. All rights reserved. The codes documented in this report are preliminary and upon coder review may  be revised to meet current compliance requirements. Midge Minium MD, MD 04/28/2022 12:50:08 PM This report has been signed electronically. Number of Addenda: 0 Note Initiated On: 04/28/2022 11:59 AM Scope Withdrawal Time: 0 hours 9 minutes 47 seconds  Total Procedure Duration: 0 hours 17 minutes 28 seconds  Estimated Blood Loss:  Estimated blood loss: none.      [facility]

## 2022-04-28 NOTE — Anesthesia Preprocedure Evaluation (Addendum)
Anesthesia Evaluation  Patient identified by MRN, date of birth, ID band Patient awake    Reviewed: Allergy & Precautions, NPO status , Patient's Chart, lab work & pertinent test results  Airway Mallampati: I  TM Distance: >3 FB Neck ROM: full    Dental  (+) Chipped,    Pulmonary neg pulmonary ROS, former smoker,    Pulmonary exam normal        Cardiovascular Exercise Tolerance: Good hypertension, Normal cardiovascular exam     Neuro/Psych negative neurological ROS  negative psych ROS   GI/Hepatic negative GI ROS, Neg liver ROS,   Endo/Other  diabetes, Type 2  Renal/GU negative Renal ROS  negative genitourinary   Musculoskeletal   Abdominal (+) + obese,   Peds  Hematology  (+) Blood dyscrasia, anemia ,   Anesthesia Other Findings Acute GI bleeding s/p blood transfusion  Reproductive/Obstetrics negative OB ROS                           Anesthesia Physical Anesthesia Plan  ASA: 3  Anesthesia Plan: General   Post-op Pain Management: Minimal or no pain anticipated   Induction: Intravenous  PONV Risk Score and Plan: Propofol infusion and TIVA  Airway Management Planned: Natural Airway  Additional Equipment:   Intra-op Plan:   Post-operative Plan:   Informed Consent: I have reviewed the patients History and Physical, chart, labs and discussed the procedure including the risks, benefits and alternatives for the proposed anesthesia with the patient or authorized representative who has indicated his/her understanding and acceptance.     Dental Advisory Given  Plan Discussed with: Anesthesiologist, CRNA and Surgeon  Anesthesia Plan Comments:        Anesthesia Quick Evaluation

## 2022-04-28 NOTE — ED Notes (Signed)
Pt reports his BMs "are just water and blood."  Sts last meal was around 1600 yesterday.  Endo RN made aware.

## 2022-04-28 NOTE — Assessment & Plan Note (Signed)
On sliding scale during hospitalization, resume oral medications on discharge

## 2022-04-28 NOTE — Anesthesia Postprocedure Evaluation (Signed)
Anesthesia Post Note  Patient: [name]  Procedure(s) Performed: COLONOSCOPY WITH PROPOFOL  Patient location during evaluation: PACU Anesthesia Type: General Level of consciousness: awake and alert Pain management: pain level controlled Vital Signs Assessment: post-procedure vital signs reviewed and stable Respiratory status: spontaneous breathing, nonlabored ventilation and respiratory function stable Cardiovascular status: blood pressure returned to baseline and stable Postop Assessment: no apparent nausea or vomiting Anesthetic complications: no   No notable events documented.   Last Vitals:  Vitals:   04/28/22 1318 04/28/22 1400  BP:  (!) 147/86  Pulse:    Resp: (!) 60   Temp:    SpO2:      Last Pain:  Vitals:   04/28/22 1400  TempSrc:   PainSc: 0-No pain                 [name]

## 2022-04-28 NOTE — Progress Notes (Signed)
   Follow Up Note  HPI: Patient is a 66 year old African-American male past medical history of diabetes mellitus and hypertension who presented to the emergency department on the night of 8/15 with complaints of lower GI bleeding.  Patient had had a colonoscopy at the [facility] in [place] on 8/14 at that time had removal of a large polyp.  Rectal bleeding had started on 8/15 night.  In the emergency room, patient found to be hypotensive and tachycardic.  Although his hemoglobin was 11.3 on arrival to the emergency department, repeat labs less than 2 hours later had dropped down to 8.1.  Patient transfused 2 units packed red blood cells and by morning of 8/16, hemoglobin had increased to 10.6.  Patient seen by gastroenterology, and taken for colonoscopy on 8/16.  Patient had 3 polyps removed in the transverse and ascending colon.  Noted to have some bleeding in the cecum secondary to previous polypectomy and patient had 3 hemostasis clips placed.  Pt admitted earlier this morning.  Seen after arrived to floor following colonoscopy.    Exam: CV: Regular rate and rhythm, S1-S2 Lungs: Clear to auscultation bilaterally Abd: Soft, nontender, nondistended, positive bowel sounds Ext: No clubbing or cyanosis or edema  Cardiovascular and Mediastinum Essential hypertension Assessment & Plan - We will continue his antihypertensives while holding off diuretics.  Digestive * GI bleeding Assessment & Plan - This is a postprocedural GI bleeding after resection of colonic polyp.  Transfused 2 units packed red blood cells.  Has been off of Eliquis for the last month and aspirin on hold.  GI took patient for colonoscopy and 3 hemostasis clips placed at area of previous polypectomy  Endocrine Type 2 diabetes mellitus without complications (HCC) Assessment & Plan - The patient will be placed on supplement coverage with NovoLog. - We will continue his glipizide. - We will hold off metformin.  Other Morbid  obesity (HCC) Assessment & Plan Meets criteria for BMI greater than 35 and comorbidity of hypertension.  Ozempic on hold  Acute postoperative anemia due to expected blood loss Assessment & Plan - Management as above. - The patient will be hydrated with IV normal saline.

## 2022-04-29 ENCOUNTER — Encounter: Payer: Self-pay | Admitting: Gastroenterology

## 2022-04-29 DIAGNOSIS — D62 Acute posthemorrhagic anemia: Secondary | ICD-10-CM | POA: Diagnosis not present

## 2022-04-29 DIAGNOSIS — I10 Essential (primary) hypertension: Secondary | ICD-10-CM | POA: Diagnosis not present

## 2022-04-29 DIAGNOSIS — K922 Gastrointestinal hemorrhage, unspecified: Secondary | ICD-10-CM | POA: Diagnosis not present

## 2022-04-29 LAB — TYPE AND SCREEN
ABO/RH(D): O POS
Antibody Screen: NEGATIVE
Unit division: 0
Unit division: 0

## 2022-04-29 LAB — BASIC METABOLIC PANEL
Anion gap: 5 (ref 5–15)
BUN: 9 mg/dL (ref 8–23)
CO2: 25 mmol/L (ref 22–32)
Calcium: 8.3 mg/dL — ABNORMAL LOW (ref 8.9–10.3)
Chloride: 108 mmol/L (ref 98–111)
Creatinine, Ser: 0.82 mg/dL (ref 0.61–1.24)
GFR, Estimated: >60 mL/min (ref >60)
Glucose, Bld: 95 mg/dL (ref 70–99)
Potassium: 3.8 mmol/L (ref 3.5–5.1)
Sodium: 138 mmol/L (ref 135–145)

## 2022-04-29 LAB — BPAM RBC
Blood Product Expiration Date: 202309092359
Blood Product Expiration Date: 202309142359
ISSUE DATE / TIME: 202308160412
ISSUE DATE / TIME: 202308161054
Unit Type and Rh: 5100
Unit Type and Rh: 5100

## 2022-04-29 LAB — CBC
HCT: 32.1 % — ABNORMAL LOW (ref 39.0–52.0)
Hemoglobin: 10.8 g/dL — ABNORMAL LOW (ref 13.0–17.0)
MCH: 31.3 pg (ref 26.0–34.0)
MCHC: 33.6 g/dL (ref 30.0–36.0)
MCV: 93 fL (ref 80.0–100.0)
Platelets: 148 10*3/uL — ABNORMAL LOW (ref 150–400)
RBC: 3.45 MIL/uL — ABNORMAL LOW (ref 4.22–5.81)
RDW: 15 % (ref 11.5–15.5)
WBC: 6.7 10*3/uL (ref 4.0–10.5)
nRBC: 0 % (ref 0.0–0.2)

## 2022-04-29 LAB — SURGICAL PATHOLOGY

## 2022-04-29 LAB — GLUCOSE, CAPILLARY
Glucose-Capillary: 167 mg/dL — ABNORMAL HIGH (ref 70–99)
Glucose-Capillary: 126 mg/dL — ABNORMAL HIGH (ref 70–99)

## 2022-04-29 LAB — HIV ANTIBODY (ROUTINE TESTING W REFLEX): HIV Screen 4th Generation wRfx: NONREACTIVE

## 2022-04-29 NOTE — Discharge Summary (Signed)
Physician Discharge Summary   Patient: Aaron Pham MRN: [id number] DOB: 15-Mar-1956  Admit date:     04/27/2022  Discharge date: 04/29/22  Discharge Physician: Hollice Espy   PCP: [facility]   Recommendations at discharge:   Patient will follow-up with his doctor at the [place] in the next month  Discharge Diagnoses: Principal Problem:   GI bleeding Active Problems:   Acute postoperative anemia due to expected blood loss   Type 2 diabetes mellitus without complications (HCC)   Essential hypertension   Morbid obesity (HCC)  Resolved Problems:   * No resolved hospital problems. [facility] Course: Patient is a 66 year old African-American male past medical history of diabetes mellitus and hypertension who presented to the emergency department on the night of 8/15 with complaints of lower GI bleeding.  Patient had had a colonoscopy at the [facility] in [place] on 8/14 at that time had removal of a large polyp.  Rectal bleeding had started on 8/15 night.  In the emergency room, patient found to be hypotensive and tachycardic.  Although his hemoglobin was 11.3 on arrival to the emergency department, repeat labs less than 2 hours later had dropped down to 8.1.  Patient transfused 2 units packed red blood cells and by morning of 8/16, hemoglobin had increased to 10.6.  Patient seen by gastroenterology, and taken for colonoscopy on 8/16.  Patient had 3 polyps removed in the transverse and ascending colon.  Noted to have some bleeding in the cecum secondary to previous polypectomy and patient had 3 hemostasis clips placed.  By following day, hemoglobin had remained stable and patient cleared for discharge.  Assessment and Plan: * GI bleeding - This is a postprocedural GI bleeding after resection of colonic polyp.  Transfused 2 units packed red blood cells.  Has been off of Eliquis for the last month and aspirin on hold.  GI took patient for colonoscopy and 3 hemostasis clips placed  at area of previous polypectomy.  Hemoglobin had remained stable by following day and patient safe for discharge  Acute postoperative anemia due to expected blood loss - Management as above. - The patient will be hydrated with IV normal saline.  Type 2 diabetes mellitus without complications (HCC) On sliding scale during hospitalization, resume oral medications on discharge  Essential hypertension Diuretics held during hospitalization, resumed on discharge  Morbid obesity (HCC) Meets criteria for BMI greater than 35 and comorbidity of hypertension.  Ozempic on hold         Consultants: Gastroenterology Procedures performed: Colonoscopy with polyp x3 removed and hemostasis clip x3 placed in the cecum near site of previous polypectomy Disposition: Home Diet recommendation:  Discharge Diet Orders (From admission, onward)     Start     Ordered   04/29/22 0000  Diet - low sodium heart healthy        04/29/22 1331           Cardiac diet DISCHARGE MEDICATION: Allergies as of 04/29/2022       Reactions   Hydroxyzine Rash        Medication List     STOP taking these medications    LISINOPRIL PO   naproxen 500 MG tablet Commonly known as: Naprosyn   traMADol 50 MG tablet Commonly known as: Ultram       TAKE these medications    cetirizine 10 MG tablet Commonly known as: ZYRTEC Take 10 mg by mouth daily.   cholecalciferol 25 MCG (1000 UNIT) tablet  Commonly known as: VITAMIN D3 Take 1,000 Units by mouth daily.   Jardiance 10 MG Tabs tablet Generic drug: empagliflozin Take 10 mg by mouth daily.   lidocaine 5 % Commonly known as: Lidoderm Place 1 patch onto the skin every 12 (twelve) hours. Remove & Discard patch within 12 hours or as directed by MD   magnesium gluconate 500 MG tablet Commonly known as: MAGONATE Take 500 mg by mouth 2 (two) times daily.   metFORMIN 1000 MG tablet Commonly known as: GLUCOPHAGE Take 1,000 mg by mouth 2 (two) times  daily with a meal.   OZEMPIC (1 MG/DOSE) [place] Inject into the skin.        Discharge Exam: Filed Weights   04/27/22 2352 04/28/22 1610  Weight: 104.3 kg 109.2 kg   General: Alert and oriented x3, no acute distress Cardiovascular: Regular rate and rhythm, S1-S2 Lungs: Clear to auscultation bilaterally  Condition at discharge: good  The results of significant diagnostics from this hospitalization (including imaging, microbiology, ancillary and laboratory) are listed below for reference.   Imaging Studies: CT ANGIO GI BLEED  Result Date: 04/28/2022 CLINICAL DATA:  Recent colonoscopy with large polyp removal, suspect active lower GI bleeding EXAM: CTA ABDOMEN AND PELVIS WITHOUT AND WITH CONTRAST TECHNIQUE: Multidetector CT imaging of the abdomen and pelvis was performed using the standard protocol during bolus administration of intravenous contrast. Multiplanar reconstructed images and MIPs were obtained and reviewed to evaluate the vascular anatomy. RADIATION DOSE REDUCTION: This exam was performed according to the departmental dose-optimization program which includes automated exposure control, adjustment of the mA and/or kV according to patient size and/or use of iterative reconstruction technique. CONTRAST:  OMNIPAQUE IOHEXOL 350 MG/ML SOLN COMPARISON:  CT abdomen and pelvis 06/11/2021. FINDINGS: VASCULAR Aorta: Atherosclerosis without significant narrowing dissection, or aneurysm. Celiac: Mild narrowing at the origin secondary to atherosclerotic plaque. SMA: Mild narrowing at the proximal SMA secondary to atherosclerotic plaque. Renals: Single left renal artery is widely patent. Dominant right renal and small accessory right renal arteries are widely patent. IMA: Patent without evidence of aneurysm, dissection, vasculitis or significant stenosis. Inflow: Scattered atherosclerotic plaque. No significant narrowing, dissection, or aneurysm. Proximal Outflow: Scattered atherosclerotic  plaque. No significant narrowing, dissection, or aneurysm. Veins: Unremarkable. Review of the MIP images confirms the above findings. NON-VASCULAR Lower chest: No acute abnormality. Hepatobiliary: No suspicious focal liver abnormality is seen. No gallstones, gallbladder wall thickening, or biliary dilatation. Pancreas: Unremarkable. No pancreatic ductal dilatation or surrounding inflammatory changes. Spleen: Normal in size without focal abnormality. Adrenals/Urinary Tract: Adrenal glands are unremarkable. Kidneys are normal, without renal calculi, suspicious focal lesion, or hydronephrosis. Unchanged benign-appearing renal cysts. Bladder is unremarkable. Stomach/Bowel: Metallic clips in the cecum compatible with history of polyp removal. No evidence of active bleeding in this area or elsewhere within the small bowel or colon. Stomach is within normal limits. The appendix is normal. No evidence of bowel wall thickening, distention, or inflammatory changes. Unchanged fat density lesion along the anterior margin of the rectum. Lymphatic: No enlarged abdominal or pelvic lymph nodes. Reproductive: Unremarkable. Other: No free intraperitoneal fluid or air. Musculoskeletal: No acute or significant osseous findings. Fat containing left inguinal hernia. IMPRESSION: VASCULAR No evidence of active bleeding in the abdomen or pelvis. NON-VASCULAR Metallic clips in the cecum likely related to polyp removal. No acute abnormality in the abdomen or pelvis. Electronically Signed   By: Minerva Fester M.D.   On: 04/28/2022 01:30    Microbiology: No results found for this or any  previous visit.  Labs: CBC: Recent Labs  Lab 04/27/22 2355 04/28/22 0142 04/28/22 0608 04/29/22 0411  WBC 9.3  --  7.1 6.7  HGB 11.3* 8.1* 10.6* 10.8*  HCT 34.9* 24.9* 32.4* 32.1*  MCV 95.9  --  94.5 93.0  PLT 198  --  157 148*   Basic Metabolic Panel: Recent Labs  Lab 04/27/22 2355 04/28/22 0608 04/29/22 0411  NA 138 138 138  K 3.9  4.1 3.8  CL 110 110 108  CO2 23 24 25   GLUCOSE 168* 115* 95  BUN 14 18 9   CREATININE 1.09 0.99 0.82  CALCIUM 8.0* 8.0* 8.3*   Liver Function Tests: Recent Labs  Lab 04/27/22 2355  AST 26  ALT 20  ALKPHOS 98  BILITOT 0.8  PROT 6.0*  ALBUMIN 3.1*   CBG: Recent Labs  Lab 04/28/22 2050 04/29/22 0800 04/29/22 1138  GLUCAP 103* 167* 126*    Discharge time spent: less than 30 minutes.  Signed: 05/01/22, MD [facility] 04/29/2022

## 2022-04-29 NOTE — Progress Notes (Signed)
Mobility Specialist - Progress Note    04/29/22 1101  Mobility  Activity Ambulated independently in hallway  Level of Assistance Independent  Assistive Device None  Distance Ambulated (ft) 120 ft  Activity Response Tolerated well  $Mobility charge 1 Mobility   Pt sitting in recliner on RA upon arrival. Pt STS and ambulates in hallway indep. Pt returns to recliner with needs in reach and visitor in room.   Terrilyn Saver  Mobility Specialist  04/29/22 11:02 AM

## 2022-04-29 NOTE — TOC CM/SW Note (Signed)
Patient has orders to discharge home today. Chart reviewed. PCP is the [facility]. On room air. No wounds. No TOC needs identified. CSW signing off. ? ?Charod Slawinski, CSW ?[phone] ? ?

## 2022-04-29 NOTE — Progress Notes (Signed)
The patient has had no further sign of bleeding and feels well.  The patient states he is supposed to be discharged today.  There is nothing further to do from a GI point of view.  The patient had 3 polyps removed and multiple clips placed on his bleeding source yesterday.  I will sign off.  Please call if any further GI concerns or questions.  We would like to thank you for the opportunity to participate in the care of Aaron Pham.

## 2022-12-31 IMAGING — CT CT ABD-PELV W/ CM
2 of 5 series · 16 of 46 positions shown, 18 images · IV contrast (APPLIED)
Comparison: CT abdomen/pelvis 04/28/2021

CLINICAL DATA: Left flank pain radiating from left back to left
groin

EXAM:
CT ABDOMEN AND PELVIS WITH CONTRAST
TECHNIQUE: Multidetector CT imaging of the abdomen and pelvis was performed
using the standard protocol following bolus administration of
intravenous contrast.
CONTRAST:  100mL OMNIPAQUE IOHEXOL 350 MG/ML SOLN

[Series 2: routine abd/pel with · axial · 0.98mm/px · z∈[+194,+624]mm · 13 of 98 slices shown, 15 images]
[im 6/98  soft-tissue]
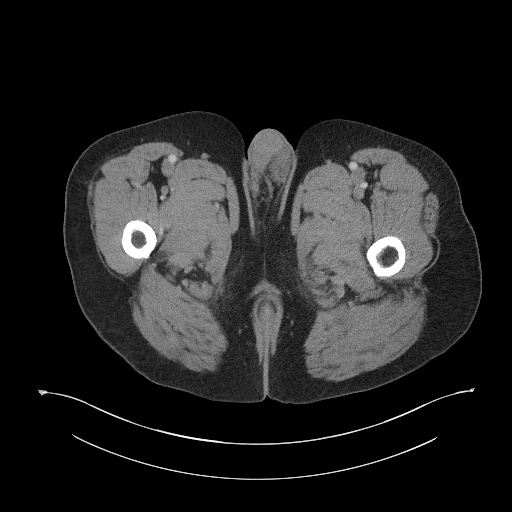
[im 6/98  bone]
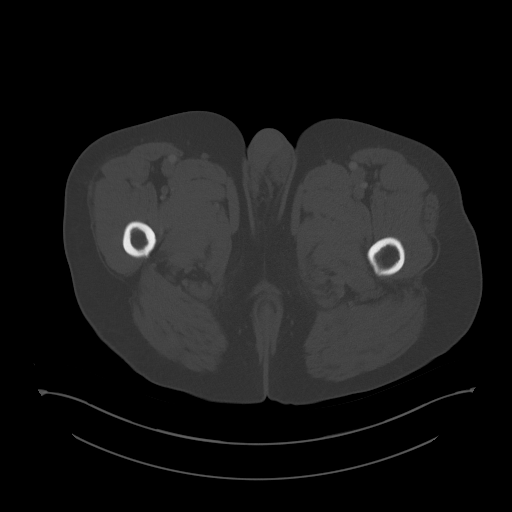
[im 11/98  soft-tissue]
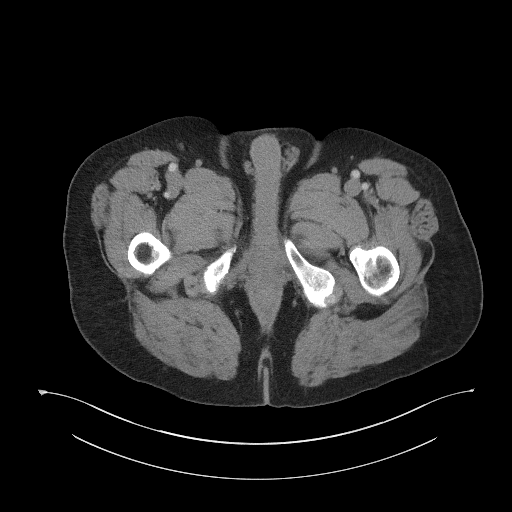
[im 22/98  soft-tissue]
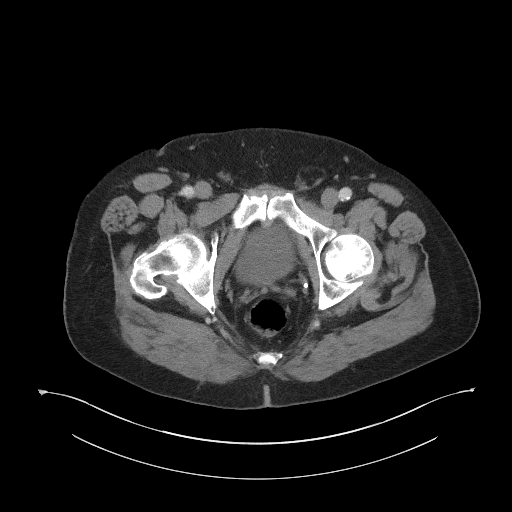
[im 27/98  soft-tissue]
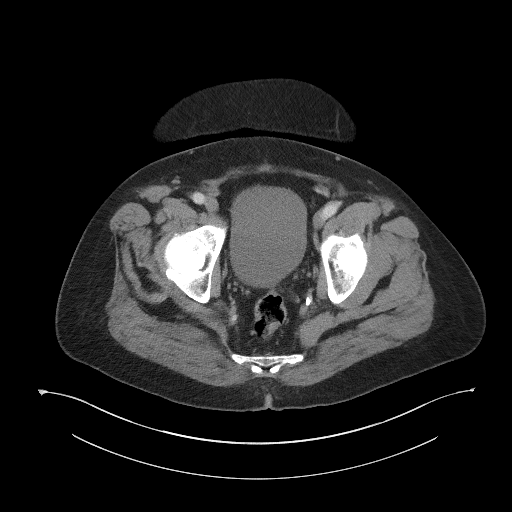
[im 33/98  soft-tissue]
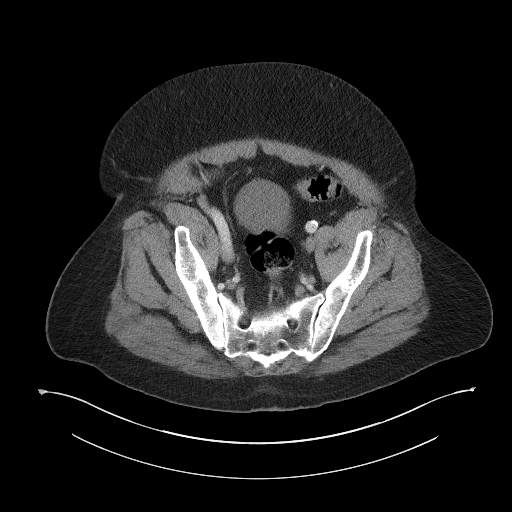
[im 44/98  soft-tissue]
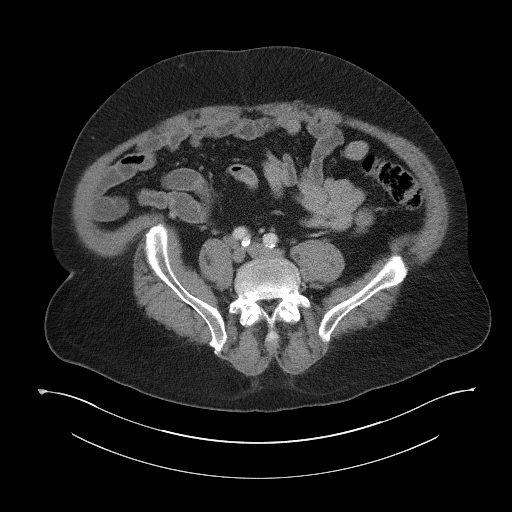
[im 49/98  soft-tissue]
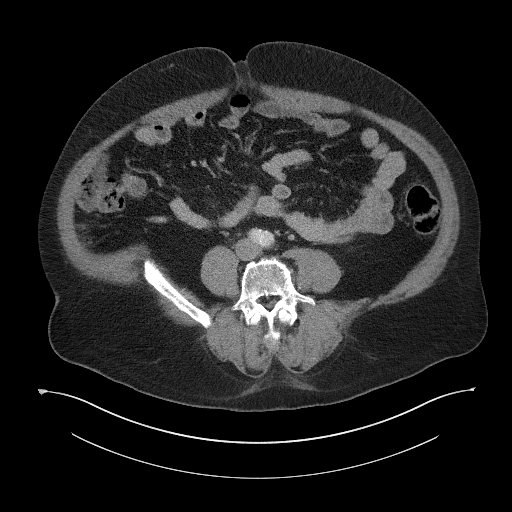
[im 54/98  soft-tissue]
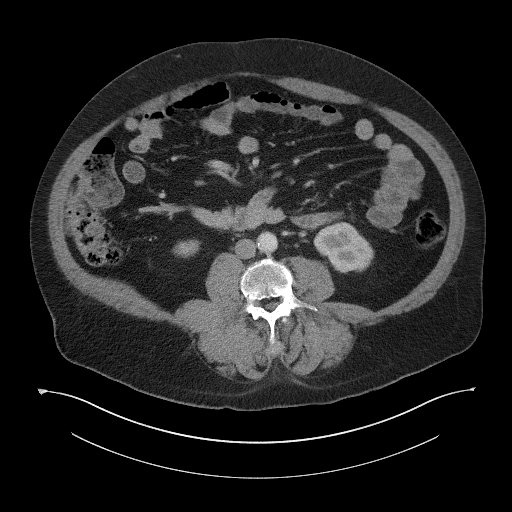
[im 65/98  soft-tissue]
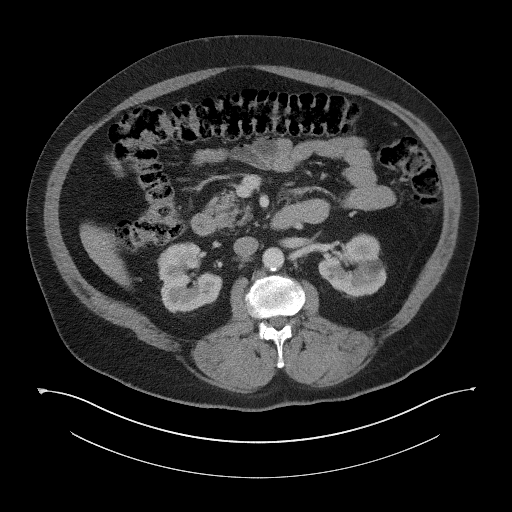
[im 65/98  bone]
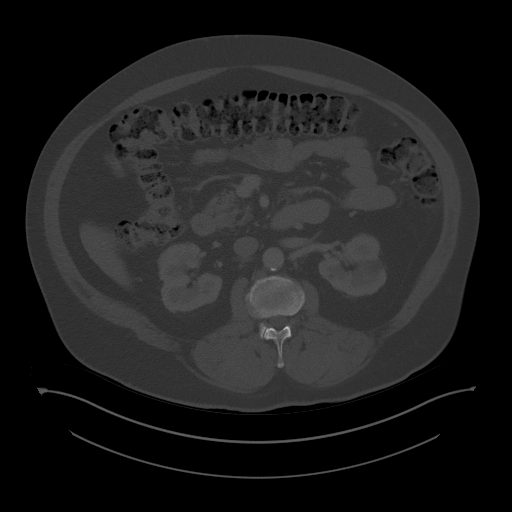
[im 71/98  soft-tissue]
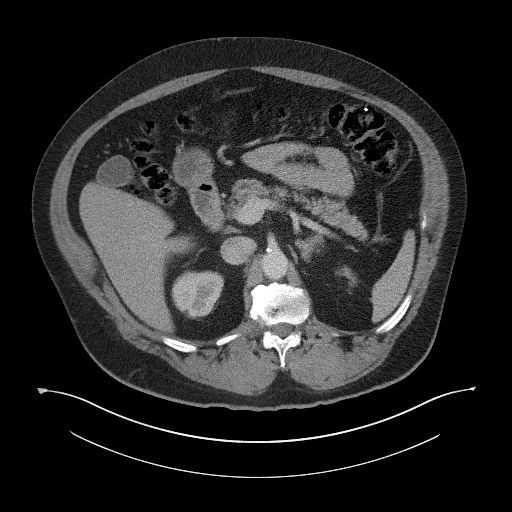
[im 76/98  soft-tissue]
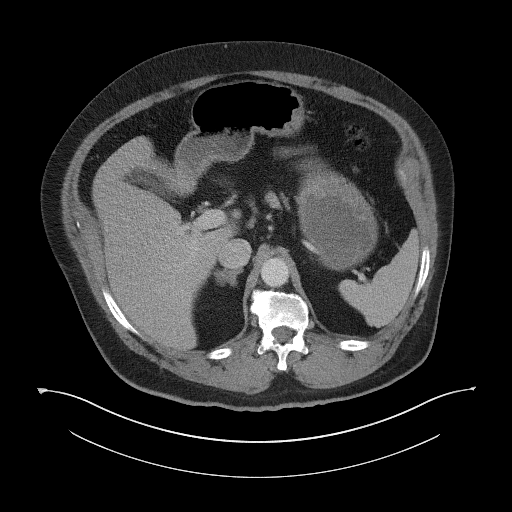
[im 87/98  soft-tissue]
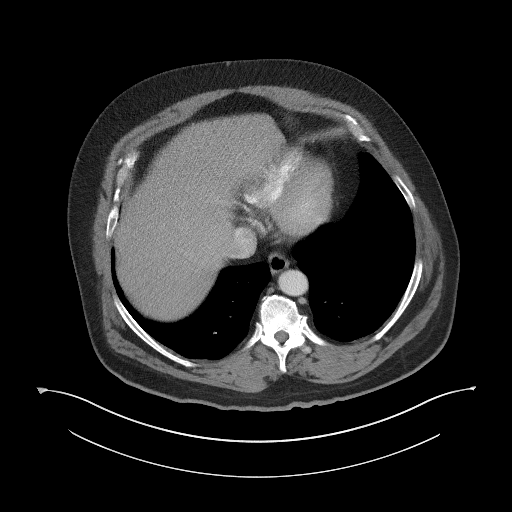
[im 92/98  soft-tissue]
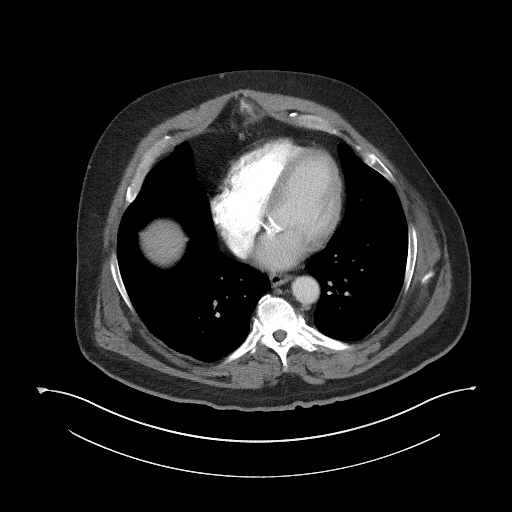

[Series 5: coronal st · coronal · 0.92mm/px · 3 of 124 slices shown]
[im 42/124  soft-tissue]
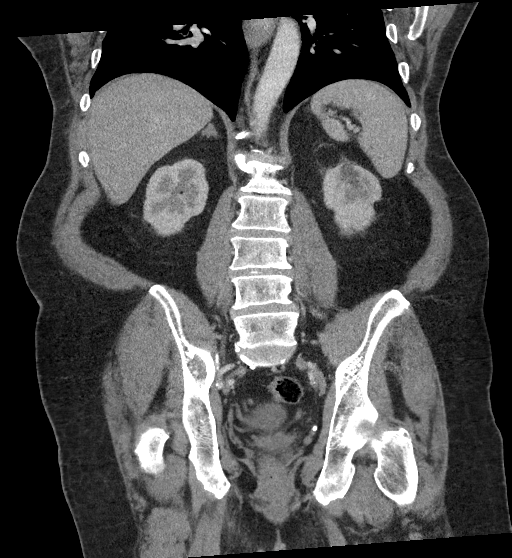
[im 55/124  soft-tissue]
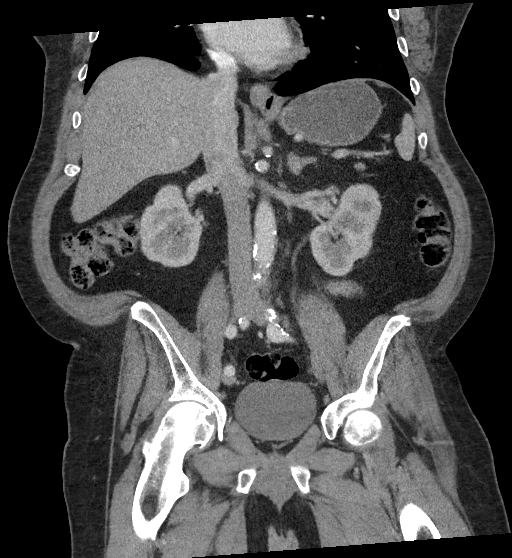
[im 69/124  soft-tissue]
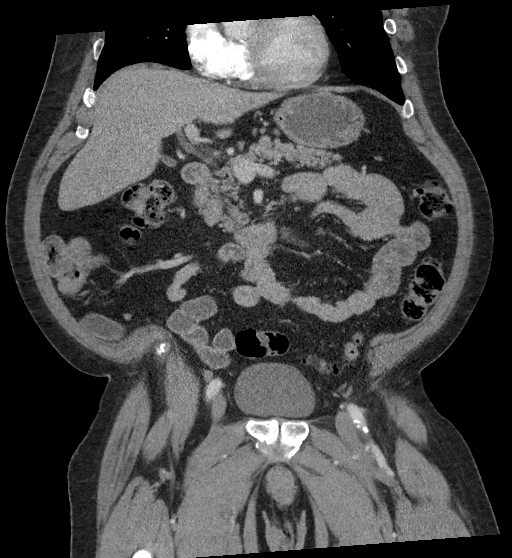

[16 of 46 positions shown; findings below may reference images not displayed]

FINDINGS: Lower chest: The lung bases are clear. The imaged heart is
unremarkable.

Hepatobiliary: The liver and gallbladder are unremarkable. There is
no biliary ductal dilatation.

Pancreas: Unremarkable.

Spleen: Unremarkable.

Adrenals/Urinary Tract: The adrenals are unremarkable.

A 1.0 cm hypodense lesion in the left upper pole and 1.2 cm
hypodense lesion in the left interpolar region most likely reflect
small cysts. There are no suspicious renal lesions. There are no
stones within either kidney or along the course of either ureter.
There is no hydronephrosis or hydroureter. The bladder is
unremarkable.

Stomach/Bowel: There is a small hiatal hernia. The stomach is
otherwise unremarkable. There is no evidence of bowel obstruction.
There is no abnormal bowel wall thickening or inflammatory change.
The appendix is normal. A fat density lesion along the anterior
margin of the rectum is unchanged, and may reflect a lipoma.

Vascular/Lymphatic: There is scattered calcified atherosclerotic
plaque throughout the nonaneurysmal abdominal aorta. The major
branch vessels are patent. The main portal and splenic veins are
patent. There is no abdominal or pelvic lymphadenopathy.

Reproductive: The prostate and seminal vesicles are unremarkable.

Other: There is no ascites or free air. There is a small fat
containing left inguinal hernia and a tiny fat containing umbilical
hernia.

Musculoskeletal: There is multilevel degenerative change of the
imaged spine. There is no acute osseous abnormality or aggressive
osseous lesion.
IMPRESSION: 1. No acute findings in the abdomen or pelvis. Specifically, no
evidence of nephrolithiasis or diverticulitis.
2. Small hypodense renal lesions again seen, most liklely cysts.

Aortic Atherosclerosis ([id number]).
